# Patient Record
Sex: Male | Born: 1990 | Race: White | Hispanic: No | Marital: Single | Smoking: Current every day smoker
Health system: Southern US, Community
[De-identification: ages and names within clinical notes are randomized; demographics above are authoritative.]

## PROBLEM LIST (undated history)

## (undated) DIAGNOSIS — J45909 Unspecified asthma, uncomplicated: Secondary | ICD-10-CM

## (undated) DIAGNOSIS — F909 Attention-deficit hyperactivity disorder, unspecified type: Secondary | ICD-10-CM

## (undated) HISTORY — PX: TONSILLECTOMY: SUR1361

## (undated) HISTORY — PX: OTHER SURGICAL HISTORY: SHX169

## (undated) HISTORY — PX: WISDOM TOOTH EXTRACTION: SHX21

## (undated) HISTORY — DX: Attention-deficit hyperactivity disorder, unspecified type: F90.9

---
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
## 2012-08-22 ENCOUNTER — Ambulatory Visit (INDEPENDENT_AMBULATORY_CARE_PROVIDER_SITE_OTHER): Payer: BC Managed Care – PPO | Admitting: Diagnostic Neuroimaging

## 2012-08-22 ENCOUNTER — Encounter: Payer: Self-pay | Admitting: Diagnostic Neuroimaging

## 2012-08-22 VITALS — BP 138/74 | Ht 73.0 in | Wt 160.0 lb

## 2012-08-22 DIAGNOSIS — R200 Anesthesia of skin: Secondary | ICD-10-CM

## 2012-08-22 DIAGNOSIS — R209 Unspecified disturbances of skin sensation: Secondary | ICD-10-CM

## 2012-08-22 MED ORDER — AMITRIPTYLINE HCL 25 MG PO TABS: 25.0000 mg | ORAL_TABLET | Freq: Every day | ORAL | Status: DC

## 2012-08-22 NOTE — Progress Notes (Signed)
[facility]  PATIENT: Kevin Craig DOB: [birth year]  REFERRING CLINICIAN: [facility] HISTORY FROM: patient REASON FOR VISIT: new consult   HISTORICAL  CHIEF COMPLAINT:  Chief Complaint  Patient presents with  . Numbness    face  . Pain    legs, feet, back    HISTORY OF PRESENT ILLNESS:   22 year old right-handed male with ADHD, here for evaluation of posttraumatic numbness in the left face.  03/26/2012, patient was playing football with his friends, when he collided with another player. Patient was hit in his left maxillary and left peri-orbital region by someone else's head.patient immediately had pain and numbness in that region. He lost vision for 30 seconds, and felt spinning sensation. He was somewhat confused and dazed for approximately 2 days. No full loss of consciousness. Patient went to the emergency room was diagnosed with multiple facial fractures, treated conservatively.  Patient followed up with ENT, had some Lortab initially, but has been taking ibuprofen 400 mg every 6 hours since that time.  Patient continues to have left periorbital numbness, pain, pins and needles, twitching sensations. He also has left hemispheric headaches g and sensation. He has some dizziness and nausea as well.  Separately patient has several year history of bilateral foot and low back pain. He works 60 hours a week, standing up on concrete floors. Otherwise he is still fairly active playing basketball with friends and working at the gym several times per week.  REVIEW OF SYSTEMS: Full 14 system review of systems performed and notable only for fatigue, testing, blurred vision double vision loss of vision eye pain spinning sensation joint pain or stiffness runny nose skin sensitivity.  ALLERGIES: No Known Allergies  HOME MEDICATIONS: No outpatient prescriptions prior to visit.   No facility-administered medications prior to visit.    PAST MEDICAL  HISTORY: Past Medical History  Diagnosis Date  . ADHD (attention deficit hyperactivity disorder)     PAST SURGICAL HISTORY: History reviewed. No pertinent past surgical history.  FAMILY HISTORY: Family History  Problem Relation Age of Onset  . Diabetes Maternal Grandmother   . Breast cancer Maternal Grandmother   . Diabetes Maternal Grandfather     SOCIAL HISTORY:  History   Social History  . Marital Status: Single    Spouse Name: N/A    Number of Children: 0  . Years of Education: HS   Occupational History  .  [employer]   Social History Main Topics  . Smoking status: Current Every Day Smoker -- 0.50 packs/day    Types: Cigarettes  . Smokeless tobacco: Not on file  . Alcohol Use: Not on file     Comment: Few beers per week  . Drug Use: No  . Sexually Active: Not on file   Other Topics Concern  . Not on file   Social History Narrative   Pt lives at home alone.   Caffeine Use- 2 sodas daily     PHYSICAL EXAM  Filed Vitals:   08/22/12 0812  BP: 138/74  Height: 6\' 1"  (1.854 m)  Weight: 160 lb (72.576 kg)   Body mass index is 21.11 kg/(m^2).  GENERAL EXAM: Patient is in no distress  CARDIOVASCULAR: Regular rate and rhythm, no murmurs, no carotid bruits  NEUROLOGIC: MENTAL STATUS: awake, alert, language fluent, comprehension intact, naming intact CRANIAL NERVE: no papilledema on fundoscopic exam, pupils equal and reactive to light, visual fields full to confrontation, extraocular muscles intact, no nystagmus, decreased facial sensation in the  left V1 region, normal facial strength, uvula midline, shoulder shrug symmetric, tongue midline. MOTOR: normal bulk and tone, full strength in the BUE, BLE SENSORY: normal and symmetric to light touch, pinprick, temperature, vibration COORDINATION: finger-nose-finger, fine finger movements normal REFLEXES: deep tendon reflexes present and symmetric GAIT/STATION: narrow based gait; able to walk on toes,  heels and tandem; romberg is negative   DIAGNOSTIC DATA (LABS, IMAGING, TESTING) - I reviewed patient records, labs, notes, testing and imaging myself where available.  No results found for this basename: WBC, HGB, HCT, MCV, PLT   No results found for this basename: na, k, cl, co2, glucose, bun, creatinine, calcium, prot, albumin, ast, alt, alkphos, bilitot, gfrnonaa, gfraa   No results found for this basename: CHOL, HDL, LDLCALC, LDLDIRECT, TRIG, CHOLHDL   No results found for this basename: HGBA1C   No results found for this basename: VITAMINB12   No results found for this basename: TSH     ASSESSMENT AND PLAN  22 y.o. year old male  has a past medical history of ADHD (attention deficit hyperactivity disorder). here with left facial numbness, following left facial trauma while playing football with some friends. Patient having ongoing headaches and numbness. Will try amitriptyline 25 mg at night. I explained to the patient that these types of injuries can take a long time to recover, even up to 6-12 months. I suggested he take a daily multivitamin as well to promote nerve healing.   Meds ordered this encounter  Medications  . amitriptyline (ELAVIL) 25 MG tablet    Sig: Take 1 tablet (25 mg total) by mouth at bedtime.    Dispense:  30 tablet    Refill:  3     Suanne Marker, MD 08/22/2012, 9:03 AM Certified in Neurology, Neurophysiology and Neuroimaging  [facility] [address] [place], [place] [address] [phone]

## 2012-08-22 NOTE — Patient Instructions (Signed)
Try amitriptyline 25mg  before bedtime. Add daily multi-vitamin. Focus on core strengthening / flexibility exercises.

## 2012-10-20 ENCOUNTER — Encounter (HOSPITAL_COMMUNITY): Payer: Self-pay | Admitting: *Deleted

## 2012-10-20 ENCOUNTER — Emergency Department (HOSPITAL_COMMUNITY): Payer: BC Managed Care – PPO

## 2012-10-20 ENCOUNTER — Emergency Department (HOSPITAL_COMMUNITY)
Admission: EM | Admit: 2012-10-20 | Discharge: 2012-10-20 | Disposition: A | Discharge status: Home / Self Care | Payer: BC Managed Care – PPO | Attending: Emergency Medicine | Admitting: Emergency Medicine

## 2012-10-20 DIAGNOSIS — R079 Chest pain, unspecified: Secondary | ICD-10-CM | POA: Insufficient documentation

## 2012-10-20 DIAGNOSIS — R209 Unspecified disturbances of skin sensation: Secondary | ICD-10-CM | POA: Insufficient documentation

## 2012-10-20 DIAGNOSIS — J441 Chronic obstructive pulmonary disease with (acute) exacerbation: Secondary | ICD-10-CM | POA: Insufficient documentation

## 2012-10-20 DIAGNOSIS — R059 Cough, unspecified: Secondary | ICD-10-CM | POA: Insufficient documentation

## 2012-10-20 DIAGNOSIS — R42 Dizziness and giddiness: Secondary | ICD-10-CM | POA: Insufficient documentation

## 2012-10-20 DIAGNOSIS — J449 Chronic obstructive pulmonary disease, unspecified: Secondary | ICD-10-CM

## 2012-10-20 DIAGNOSIS — R0600 Dyspnea, unspecified: Secondary | ICD-10-CM

## 2012-10-20 DIAGNOSIS — R05 Cough: Secondary | ICD-10-CM | POA: Insufficient documentation

## 2012-10-20 DIAGNOSIS — F17200 Nicotine dependence, unspecified, uncomplicated: Secondary | ICD-10-CM | POA: Insufficient documentation

## 2012-10-20 DIAGNOSIS — Z8659 Personal history of other mental and behavioral disorders: Secondary | ICD-10-CM | POA: Insufficient documentation

## 2012-10-20 DIAGNOSIS — R110 Nausea: Secondary | ICD-10-CM | POA: Insufficient documentation

## 2012-10-20 HISTORY — DX: Unspecified asthma, uncomplicated: J45.909

## 2012-10-20 LAB — BLOOD GAS, ARTERIAL
Acid-Base Excess: 0.9 mmol/L (ref 0.0–2.0)
Bicarbonate: 25 mEq/L — ABNORMAL HIGH (ref 20.0–24.0)
O2 Saturation: 98.5 %
Patient temperature: 37
TCO2: 21.8 mmol/L (ref 0–100)
pCO2 arterial: 40.2 mmHg (ref 35.0–45.0)
pH, Arterial: 7.411 (ref 7.350–7.450)
pO2, Arterial: 114 mmHg — ABNORMAL HIGH (ref 80.0–100.0)

## 2012-10-20 LAB — POCT I-STAT TROPONIN I: Troponin i, poc: 0.01 ng/mL (ref 0.00–0.08)

## 2012-10-20 LAB — POCT I-STAT, CHEM 8
BUN: 14 mg/dL (ref 6–23)
Calcium, Ion: 1.18 mmol/L (ref 1.12–1.23)
Chloride: 106 mEq/L (ref 96–112)
Creatinine, Ser: 1 mg/dL (ref 0.50–1.35)
Glucose, Bld: 94 mg/dL (ref 70–99)
HCT: 47 % (ref 39.0–52.0)
Hemoglobin: 16 g/dL (ref 13.0–17.0)
Potassium: 3.6 mEq/L (ref 3.5–5.1)
Sodium: 141 mEq/L (ref 135–145)
TCO2: 24 mmol/L (ref 0–100)

## 2012-10-20 LAB — D-DIMER, QUANTITATIVE: D-Dimer, Quant: 0.29 ug/mL-FEU (ref 0.00–0.48)

## 2012-10-20 MED ORDER — ALBUTEROL SULFATE (5 MG/ML) 0.5% IN NEBU
INHALATION_SOLUTION | RESPIRATORY_TRACT | Status: AC
  Administered 2012-10-20: 5 mg
  Filled 2012-10-20: qty 1

## 2012-10-20 MED ORDER — ALBUTEROL SULFATE (5 MG/ML) 0.5% IN NEBU
5.0000 mg | INHALATION_SOLUTION | Freq: Once | RESPIRATORY_TRACT | Status: AC
  Administered 2012-10-20: 5 mg via RESPIRATORY_TRACT
  Filled 2012-10-20: qty 1

## 2012-10-20 MED ORDER — ASPIRIN 81 MG PO CHEW
324.0000 mg | CHEWABLE_TABLET | Freq: Once | ORAL | Status: AC
  Administered 2012-10-20: 324 mg via ORAL
  Filled 2012-10-20: qty 4

## 2012-10-20 MED ORDER — ALBUTEROL SULFATE HFA 108 (90 BASE) MCG/ACT IN AERS
2.0000 | INHALATION_SPRAY | RESPIRATORY_TRACT | Status: DC | PRN
  Administered 2012-10-20: 2 via RESPIRATORY_TRACT
  Filled 2012-10-20: qty 6.7

## 2012-10-20 NOTE — ED Provider Notes (Signed)
History     This chart was scribed for Kevin Quarry, MD, MD by Smitty Pluck, ED Scribe. The patient was seen in room APA10/APA10 and the patient's care was started at 7:31 AM.   CSN: [id number]  Arrival date & time 10/20/12  0631      Chief Complaint  Patient presents with  . Chest Pain  . Dizziness    Patient is a 22 y.o. male presenting with chest pain. The history is provided by the patient and medical records. No language interpreter was used.  Chest Pain Pain location:  L chest Pain quality: dull and sharp   Pain radiates to:  L shoulder Pain radiates to the back: no   Pain severity:  Moderate Onset quality:  Sudden Timing:  Intermittent Progression:  Worsening Chronicity:  Recurrent Relieved by:  Nothing Worsened by:  Deep breathing and exertion Associated symptoms: dizziness, nausea and numbness   Associated symptoms: no fever and not vomiting    HPI Comments: Kevin Craig is a 22 y.o. male with hx of asthma who presents to the Emergency Department complaining of intermittent, moderate left chest pain that has been ongoing for past year but worsened within past week. He states the chest pain is dull and sharp. He reports that the pain radiates across his chest to left shoulder. He mentions the episodes last for hours. Pain is rated at 9/10 at worse and currently 5/10. He mentions that sleeping relieves the pain and deeply breathing aggravates the pain. He states that his job involves strenuous activity. He mentions having associated dizziness, nausea, productive cough with white sputum, numbness in bilateral arms and face and SOB.  Pt denies diaphoresis, fever, chills, vomiting, diarrhea, weakness and any other pain. Pt reports he smokes cigarettes. Denies hx of DVT, MI and any other cardiac problems. No family hx of pneumothorax.     Does not have PCP.    Past Medical History  Diagnosis Date  . ADHD (attention deficit hyperactivity disorder)     History reviewed.  No pertinent past surgical history.  Family History  Problem Relation Age of Onset  . Diabetes Maternal Grandmother   . Breast cancer Maternal Grandmother   . Diabetes Maternal Grandfather     History  Substance Use Topics  . Smoking status: Current Every Day Smoker -- 0.50 packs/day    Types: Cigarettes  . Smokeless tobacco: Not on file  . Alcohol Use: Not on file     Comment: Few beers per week      Review of Systems  Constitutional: Negative for fever and chills.  Cardiovascular: Positive for chest pain.  Gastrointestinal: Positive for nausea. Negative for vomiting.  Neurological: Positive for dizziness and numbness.  All other systems reviewed and are negative.    Allergies  Review of patient's allergies indicates no known allergies.  Home Medications  No current outpatient prescriptions on file.  BP 144/87  Pulse 67  Temp(Src) 97.4 F (36.3 C) (Oral)  Resp 20  Ht 6' 2.5" (1.892 m)  Wt 165 lb (74.844 kg)  BMI 20.91 kg/m2  SpO2 100%  Physical Exam  Nursing note and vitals reviewed. Constitutional: He is oriented to person, place, and time. He appears well-developed and well-nourished. No distress.  HENT:  Head: Normocephalic and atraumatic.  Right Ear: External ear normal.  Left Ear: External ear normal.  Nose: Nose normal.  Mouth/Throat: Oropharynx is clear and moist.  Eyes: Conjunctivae and EOM are normal. Pupils are equal, round, and reactive  to light.  Neck: Normal range of motion. Neck supple.  Cardiovascular: Normal rate, regular rhythm, normal heart sounds and intact distal pulses.   No murmur heard. Pulmonary/Chest: Effort normal and breath sounds normal. No respiratory distress. He has no wheezes. He has no rales. He exhibits no tenderness.  Abdominal: Soft. Bowel sounds are normal. He exhibits no distension and no mass. There is no tenderness. There is no guarding.  Musculoskeletal: Normal range of motion. He exhibits no edema.  Neurological:  He is alert and oriented to person, place, and time. He has normal reflexes. He exhibits normal muscle tone. Coordination normal.  Skin: Skin is warm and dry.  Psychiatric: He has a normal mood and affect. His behavior is normal. Judgment and thought content normal.    ED Course  Procedures (including critical care time) DIAGNOSTIC STUDIES: Oxygen Saturation is 100% on room air, normal by my interpretation.    COORDINATION OF CARE: 7:39 AM Discussed ED treatment with pt and pt agrees.  7:40 AM Ordered:  Medications  albuterol (PROVENTIL) (5 MG/ML) 0.5% nebulizer solution 5 mg (5 mg Nebulization Given 10/20/12 0804)  aspirin chewable tablet 324 mg (324 mg Oral Given 10/20/12 0804)      Labs Reviewed  D-DIMER, QUANTITATIVE  BLOOD GAS, ARTERIAL  POCT I-STAT, CHEM 8  POCT I-STAT TROPONIN I   Dg Chest 2 View  10/20/2012   *RADIOLOGY REPORT*  Clinical Data: Chest pain  CHEST - 2 VIEW  Comparison: None.  Findings: The heart and pulmonary vascularity are within normal limits.  The lungs are well-aerated mildly hyperinflated.  No acute bony abnormality is seen.  IMPRESSION: COPD without acute abnormality.   Original Report Authenticated By: Alcide Clever, M.D.    I have reviewed the report and personally reviewed the above radiology studies.   No diagnosis found.    Date: 10/20/2012  Rate: 57  Rhythm: normal sinus rhythm  QRS Axis: normal  Intervals: normal  ST/T Wave abnormalities: normal  Conduction Disutrbances:none  Narrative Interpretation:   Old EKG Reviewed: none available   MDM  Patient with sharp chest pain for a month with some dyspnea.  ddx- cad-atypical pain, no acute changes, normal troponin, low risk given age and history  Pneumothorax- no evidence on cxr  pe- normal d-dimer  Myositis/pericarditis- normal cardiac silhouette  Pneumonia No fever, cough, infiltrate  Obstructive lung disease patient with known asthma not taking albuterol as scheduled, continues to  smoke, but no obvious source of pain.  Patient given albuterol mdi.  Advised close follow up and return precautions given.   Kevin Quarry, MD 10/20/12 1011

## 2012-10-20 NOTE — ED Notes (Signed)
Patient has had episodes of this same chest pain x 1-2 years.  States it can happen while at rest or with activity.  Associated w/lightheadedness, occasional SOB and difficulty taking deep breath d/t pain. No nausea.  Most recent episode began yesterday while mowing grass.  Also notes he experiences feeling like his heart is racing, then when rate settles, feels like heart is pounding harder. Pain varies in intensity and quality, from sharp to dull, severe to mild.  Primarily located over left chest, radiating to shoulder and back, but has also experienced pain across entirety of chest.  Immediate family history of heart disease.  Believes his sister has irregular heartbeat.

## 2012-10-20 NOTE — ED Notes (Signed)
Notified Dr. Rosalia Hammers that patient wants to speak to her prior to discharge.

## 2012-10-20 NOTE — ED Notes (Signed)
Pt states he has had bouts of chest pain over the last 2 days. Tonight, the chest pain woke him up from his sleep. Pt describes left sided chest pain as sharp and tight. Now pain is dull but his chest still feels tight and is radiating to his left back. Pt states it is getting harder for him to breath.

## 2012-10-20 NOTE — ED Notes (Signed)
After neb treatment, patient states it is easier to breath, but pain in L chest is still present.

## 2013-03-09 ENCOUNTER — Telehealth: Payer: Self-pay | Admitting: Diagnostic Neuroimaging

## 2013-03-09 NOTE — Telephone Encounter (Signed)
Called pt to remind him of his apointment  03/11/13 with [facility] phone# is no longer in service.

## 2013-03-11 ENCOUNTER — Ambulatory Visit: Payer: BC Managed Care – PPO | Admitting: Diagnostic Neuroimaging

## 2014-06-18 ENCOUNTER — Encounter (HOSPITAL_COMMUNITY): Payer: Self-pay

## 2014-06-18 ENCOUNTER — Emergency Department (HOSPITAL_COMMUNITY)
Admission: EM | Admit: 2014-06-18 | Discharge: 2014-06-18 | Disposition: A | Discharge status: Home / Self Care | Payer: 59 | Attending: Emergency Medicine | Admitting: Emergency Medicine

## 2014-06-18 DIAGNOSIS — J45909 Unspecified asthma, uncomplicated: Secondary | ICD-10-CM | POA: Diagnosis not present

## 2014-06-18 DIAGNOSIS — Z720 Tobacco use: Secondary | ICD-10-CM | POA: Diagnosis not present

## 2014-06-18 DIAGNOSIS — M545 Low back pain: Secondary | ICD-10-CM | POA: Diagnosis present

## 2014-06-18 DIAGNOSIS — Z8659 Personal history of other mental and behavioral disorders: Secondary | ICD-10-CM | POA: Diagnosis not present

## 2014-06-18 DIAGNOSIS — M5431 Sciatica, right side: Secondary | ICD-10-CM | POA: Diagnosis not present

## 2014-06-18 LAB — URINALYSIS, ROUTINE W REFLEX MICROSCOPIC
Bilirubin Urine: NEGATIVE
Glucose, UA: NEGATIVE mg/dL
Ketones, ur: NEGATIVE mg/dL
Leukocytes, UA: NEGATIVE
Nitrite: NEGATIVE
Protein, ur: NEGATIVE mg/dL
Specific Gravity, Urine: >1.03 — ABNORMAL HIGH (ref 1.005–1.030)
Urobilinogen, UA: 0.2 mg/dL (ref 0.0–1.0)
pH: 6 (ref 5.0–8.0)

## 2014-06-18 LAB — URINE MICROSCOPIC-ADD ON

## 2014-06-18 MED ORDER — ONDANSETRON 4 MG PO TBDP
4.0000 mg | ORAL_TABLET | Freq: Once | ORAL | Status: AC
  Administered 2014-06-18: 4 mg via ORAL
  Filled 2014-06-18: qty 1

## 2014-06-18 MED ORDER — OXYCODONE-ACETAMINOPHEN 5-325 MG PO TABS
1.0000 | ORAL_TABLET | Freq: Once | ORAL | Status: AC
  Administered 2014-06-18: 1 via ORAL
  Filled 2014-06-18: qty 1

## 2014-06-18 MED ORDER — CYCLOBENZAPRINE HCL 10 MG PO TABS
10.0000 mg | ORAL_TABLET | Freq: Once | ORAL | Status: AC
  Administered 2014-06-18: 10 mg via ORAL
  Filled 2014-06-18: qty 1

## 2014-06-18 MED ORDER — KETOROLAC TROMETHAMINE 60 MG/2ML IM SOLN
60.0000 mg | Freq: Once | INTRAMUSCULAR | Status: AC
  Administered 2014-06-18: 60 mg via INTRAMUSCULAR
  Filled 2014-06-18: qty 2

## 2014-06-18 MED ORDER — OXYCODONE-ACETAMINOPHEN 5-325 MG PO TABS: 1.0000 | ORAL_TABLET | Freq: Four times a day (QID) | ORAL | Status: DC | PRN

## 2014-06-18 MED ORDER — CYCLOBENZAPRINE HCL 10 MG PO TABS: 10.0000 mg | ORAL_TABLET | Freq: Two times a day (BID) | ORAL | Status: DC | PRN

## 2014-06-18 MED ORDER — NAPROXEN 500 MG PO TABS: 500.0000 mg | ORAL_TABLET | Freq: Two times a day (BID) | ORAL | Status: DC

## 2014-06-18 NOTE — Discharge Instructions (Signed)
Your exam today shows that your pain is muscular. You do have a trace of blood in your urine that should be followed up by your primary care doctor. We are treating your pain and muscle spasm. Take the medications as directed. Do not take the muscle relaxant or narcotic if you are driving as it will make you sleepy.

## 2014-06-18 NOTE — ED Provider Notes (Signed)
CSN: [id number]     Arrival date & time 06/18/14  0825 History   First MD Initiated Contact with Patient 06/18/14 0830     Chief Complaint  Patient presents with  . Back Pain     (Consider location/radiation/quality/duration/timing/severity/associated sxs/prior Treatment) Patient is a 24 y.o. male presenting with back pain. The history is provided by the patient.  Back Pain Location:  Lumbar spine Quality:  Shooting Radiates to: right buttock. Pain severity:  Severe Pain is:  Same all the time Onset quality:  Sudden Duration:  1 day Timing:  Constant Chronicity:  New Relieved by:  Nothing Worsened by:  Movement, standing, bending and ambulation Associated symptoms: no bladder incontinence, no bowel incontinence and no dysuria    Kevin Craig is a 24 y.o. male who presents to the ED with low right side back pain that started yesterday. Patient states he got in his truck to go to work and had a sudden pain in his lower back. He has continued to have the pain. He has taken ibuprofen without relief. He denies UTI symptoms.  Past Medical History  Diagnosis Date  . ADHD (attention deficit hyperactivity disorder)   . Asthma    Past Surgical History  Procedure Laterality Date  . Tonsillectomy    . Tubes in ears     Family History  Problem Relation Age of Onset  . Diabetes Maternal Grandmother   . Breast cancer Maternal Grandmother   . Diabetes Maternal Grandfather    History  Substance Use Topics  . Smoking status: Current Every Day Smoker -- 0.50 packs/day    Types: Cigarettes  . Smokeless tobacco: Not on file  . Alcohol Use: Not on file     Comment: Few beers per week    Review of Systems  Gastrointestinal: Negative for bowel incontinence.  Genitourinary: Negative for bladder incontinence and dysuria.  Musculoskeletal: Positive for back pain.  all other systems negative    Allergies  Review of patient's allergies indicates no known allergies.  Home  Medications   Prior to Admission medications   Medication Sig Start Date End Date Taking? Authorizing Provider  ibuprofen (ADVIL,MOTRIN) 200 MG tablet Take 400 mg by mouth every 8 (eight) hours as needed for moderate pain.    Yes Historical Provider, MD  oxymetazoline (AFRIN) 0.05 % nasal spray Place 1 spray into both nostrils 2 (two) times daily as needed for congestion.   Yes Historical Provider, MD  cyclobenzaprine (FLEXERIL) 10 MG tablet Take 1 tablet (10 mg total) by mouth 2 (two) times daily as needed for muscle spasms. 06/18/14   Tatumn Corbridge Orlene Och, NP  naproxen (NAPROSYN) 500 MG tablet Take 1 tablet (500 mg total) by mouth 2 (two) times daily. 06/18/14   Xai Frerking Orlene Och, NP  oxyCODONE-acetaminophen (ROXICET) 5-325 MG per tablet Take 1 tablet by mouth every 6 (six) hours as needed for severe pain. 06/18/14   Kailin Principato Orlene Och, NP   BP 123/89 mmHg  Pulse 99  Temp(Src) 97.6 F (36.4 C) (Oral)  Resp 16  Ht  (1.88 m)  Wt 170 lb (77.111 kg)  BMI 21.82 kg/m2  SpO2 100% Physical Exam  Constitutional: He is oriented to person, place, and time. He appears well-developed and well-nourished. No distress.  HENT:  Head: Normocephalic and atraumatic.  Eyes: EOM are normal. Pupils are equal, round, and reactive to light.  Neck: Normal range of motion. Neck supple.  Cardiovascular: Normal rate and regular rhythm.   Pulmonary/Chest: Effort normal.  No respiratory distress. He has no wheezes. He has no rales.  Abdominal: Soft. Bowel sounds are normal. There is no tenderness.  Musculoskeletal: He exhibits no edema.       Lumbar back: He exhibits tenderness, pain and spasm. He exhibits normal range of motion, no bony tenderness, no deformity and normal pulse.       Back:  Pain with straight leg raises. Radial and pedal pulses 2+, adequate circulation, good touch sensation.   Neurological: He is alert and oriented to person, place, and time. He has normal strength. No cranial nerve deficit or sensory deficit.  Coordination and gait normal.  Reflex Scores:      Bicep reflexes are 2+ on the right side and 2+ on the left side.      Brachioradialis reflexes are 2+ on the right side and 2+ on the left side.      Patellar reflexes are 2+ on the right side and 2+ on the left side.      Achilles reflexes are 2+ on the right side and 2+ on the left side. Ambulatory without foot drag.   Skin: Skin is warm and dry.  Psychiatric: He has a normal mood and affect. His behavior is normal.  Nursing note and vitals reviewed.   ED Course  Procedures  Results for orders placed or performed during the hospital encounter of 06/18/14 (from the past 24 hour(s))  Urinalysis, Routine w reflex microscopic     Status: Abnormal   Collection Time: 06/18/14  8:49 AM  Result Value Ref Range   Color, Urine YELLOW YELLOW   APPearance CLEAR CLEAR   Specific Gravity, Urine >1.030 (H) 1.005 - 1.030   pH 6.0 5.0 - 8.0   Glucose, UA NEGATIVE NEGATIVE mg/dL   Hgb urine dipstick TRACE (A) NEGATIVE   Bilirubin Urine NEGATIVE NEGATIVE   Ketones, ur NEGATIVE NEGATIVE mg/dL   Protein, ur NEGATIVE NEGATIVE mg/dL   Urobilinogen, UA 0.2 0.0 - 1.0 mg/dL   Nitrite NEGATIVE NEGATIVE   Leukocytes, UA NEGATIVE NEGATIVE  Urine microscopic-add on     Status: None   Collection Time: 06/18/14  8:49 AM  Result Value Ref Range   RBC / HPF 3-6 <3 RBC/hpf    I discussed this case with Dr. Hyacinth MeekerMiller. Patient does have a trace of blood in his urine but exam does not indicate kidney stone but rather muscular pain.  MDM  24 y.o. male with low back pain that started suddenly yesterday and has gotten progressively worse. Will treat for pain and muscle spasm. Patient stable for discharge without neuro deficits and without symptoms that would require immediate neuro consult. Discussed with the patient and all questioned fully answered. He will return if any problems arise.   Final diagnoses:  Sciatica, right        [facility] M Llewellyn Schoenberger, NP 06/18/14  1710  Vida RollerBrian D Miller, MD 06/19/14 1714

## 2014-06-18 NOTE — ED Notes (Signed)
MD at bedside. 

## 2014-06-18 NOTE — ED Notes (Addendum)
Pt reports last night at work started having pain in r lower back.  Reports throughout the night the pain has radiated across lower back and hips.  Denies injury, denies urinary symptoms.  Reports pain worse with movement.

## 2018-10-23 ENCOUNTER — Emergency Department (HOSPITAL_COMMUNITY)
Admission: EM | Admit: 2018-10-23 | Discharge: 2018-10-23 | Disposition: A | Discharge status: Home / Self Care | Payer: 59 | Attending: Emergency Medicine | Admitting: Emergency Medicine

## 2018-10-23 ENCOUNTER — Other Ambulatory Visit: Payer: Self-pay

## 2018-10-23 ENCOUNTER — Encounter (HOSPITAL_COMMUNITY): Payer: Self-pay | Admitting: Emergency Medicine

## 2018-10-23 ENCOUNTER — Emergency Department (HOSPITAL_COMMUNITY): Payer: 59

## 2018-10-23 DIAGNOSIS — J45909 Unspecified asthma, uncomplicated: Secondary | ICD-10-CM | POA: Diagnosis not present

## 2018-10-23 DIAGNOSIS — R072 Precordial pain: Secondary | ICD-10-CM | POA: Diagnosis not present

## 2018-10-23 DIAGNOSIS — F17210 Nicotine dependence, cigarettes, uncomplicated: Secondary | ICD-10-CM | POA: Insufficient documentation

## 2018-10-23 DIAGNOSIS — R079 Chest pain, unspecified: Secondary | ICD-10-CM | POA: Diagnosis present

## 2018-10-23 LAB — BASIC METABOLIC PANEL
Anion gap: 8 (ref 5–15)
BUN: 15 mg/dL (ref 6–20)
CO2: 27 mmol/L (ref 22–32)
Calcium: 9.7 mg/dL (ref 8.9–10.3)
Chloride: 107 mmol/L (ref 98–111)
Creatinine, Ser: 0.95 mg/dL (ref 0.61–1.24)
GFR calc Af Amer: >60 mL/min (ref >60)
GFR calc non Af Amer: >60 mL/min (ref >60)
Glucose, Bld: 92 mg/dL (ref 70–99)
Potassium: 3.8 mmol/L (ref 3.5–5.1)
Sodium: 142 mmol/L (ref 135–145)

## 2018-10-23 LAB — CBC
HCT: 45.6 % (ref 39.0–52.0)
Hemoglobin: 15.1 g/dL (ref 13.0–17.0)
MCH: 31.1 pg (ref 26.0–34.0)
MCHC: 33.1 g/dL (ref 30.0–36.0)
MCV: 94 fL (ref 80.0–100.0)
Platelets: 210 10*3/uL (ref 150–400)
RBC: 4.85 MIL/uL (ref 4.22–5.81)
RDW: 13.4 % (ref 11.5–15.5)
WBC: 7.4 10*3/uL (ref 4.0–10.5)
nRBC: 0 % (ref 0.0–0.2)

## 2018-10-23 LAB — TROPONIN I: Troponin I: <0.03 ng/mL (ref <0.03)

## 2018-10-23 LAB — D-DIMER, QUANTITATIVE: D-Dimer, Quant: <0.27 ug/mL-FEU (ref 0.00–0.50)

## 2018-10-23 MED ORDER — IBUPROFEN 800 MG PO TABS: 800.0000 mg | ORAL_TABLET | Freq: Three times a day (TID) | ORAL | 0 refills | Status: DC | PRN

## 2018-10-23 MED ORDER — SODIUM CHLORIDE 0.9% FLUSH: 3.0000 mL | Freq: Once | INTRAVENOUS | Status: DC

## 2018-10-23 MED ORDER — DICLOFENAC SODIUM 1 % TD GEL: 2.0000 g | Freq: Four times a day (QID) | TRANSDERMAL | 0 refills | Status: DC | PRN

## 2018-10-23 NOTE — ED Triage Notes (Signed)
Patient reports L upper chest pain that started a day and a half ago.

## 2018-10-23 NOTE — ED Provider Notes (Signed)
Emergency Department Provider Note   I have reviewed the triage vital signs and the nursing notes.   HISTORY  Chief Complaint Chest Pain   HPI Kevin Craig is a 28 y.o. male with PMH of ADHD, asthma, and tobacco use presents to the emergency department for evaluation of left-sided chest pain which began yesterday.  Patient states that he had sudden sharp chest pain in the left to mid chest.  He inhaled deeply and felt a popping sensation followed by constant pressure.  The pressure sensation radiates to underneath his left shoulder blade. The pressure has been constant since pain began yesterday.  He is feeling somewhat short of breath.  Pain is worse with moving the left shoulder or "flexing." No other modifying factors. No fever or cough.   Past Medical History:  Diagnosis Date  . ADHD (attention deficit hyperactivity disorder)   . Asthma     Patient Active Problem List   Diagnosis Date Noted  . Left facial numbness 08/22/2012    Past Surgical History:  Procedure Laterality Date  . TONSILLECTOMY    . tubes in ears    . WISDOM TOOTH EXTRACTION      Allergies Patient has no known allergies.  Family History  Problem Relation Age of Onset  . Diabetes Maternal Grandmother   . Breast cancer Maternal Grandmother   . Diabetes Maternal Grandfather     Social History Social History   Tobacco Use  . Smoking status: Current Every Day Smoker    Packs/day: 0.50    Types: Cigarettes  . Smokeless tobacco: Former Engineer, waterUser  Substance Use Topics  . Alcohol use: Not on file    Comment: every other day  . Drug use: No    Review of Systems  Constitutional: No fever/chills Eyes: No visual changes. ENT: No sore throat. Cardiovascular: Positive chest pain. Respiratory: Positive shortness of breath. Gastrointestinal: No abdominal pain.  No nausea, no vomiting.  No diarrhea.  No constipation. Genitourinary: Negative for dysuria. Musculoskeletal: Negative for back pain.  Skin: Negative for rash. Neurological: Negative for headaches, focal weakness or numbness.  10-point ROS otherwise negative.  ____________________________________________   PHYSICAL EXAM:  VITAL SIGNS: ED Triage Vitals  Enc Vitals Group     BP 10/23/18 1850 (!) 147/91     Pulse Rate 10/23/18 1850 63     Resp 10/23/18 1850 20     Temp 10/23/18 1850 98.7 F (37.1 C)     Temp Source 10/23/18 1850 Temporal     SpO2 10/23/18 1850 100 %     Weight 10/23/18 1850 175 lb (79.4 kg)     Height 10/23/18 1850 6\' 3"  (1.905 m)     Pain Score 10/23/18 1852 7   Constitutional: Alert and oriented. Well appearing and in no acute distress. Eyes: Conjunctivae are normal.  Head: Atraumatic. Nose: No congestion/rhinnorhea. Mouth/Throat: Mucous membranes are moist.  Neck: No stridor.  Cardiovascular: Normal rate, regular rhythm. Good peripheral circulation. Grossly normal heart sounds.   Respiratory: Normal respiratory effort.  No retractions. Lungs CTAB. Gastrointestinal: Soft and nontender. No distention.  Musculoskeletal: No lower extremity tenderness nor edema. No gross deformities of extremities. Tenderness to palpation over the sternums and left anterior chest. No crepitus or deformity/bruising.  Neurologic:  Normal speech and language.  Skin:  Skin is warm, dry and intact. No rash noted.   ____________________________________________   LABS (all labs ordered are listed, but only abnormal results are displayed)  Labs Reviewed  BASIC METABOLIC PANEL  CBC  TROPONIN I  D-DIMER, QUANTITATIVE (NOT AT [facility])   ____________________________________________  EKG   EKG Interpretation  Date/Time:  Thursday October 23 2018 18:55:41 EDT Ventricular Rate:  74 PR Interval:  142 QRS Duration: 98 QT Interval:  364 QTC Calculation: 404 R Axis:   120 Text Interpretation:  Normal sinus rhythm with sinus arrhythmia Right atrial enlargement Incomplete right bundle branch block Abnormal ECG Similar  to prior. No STEMI  Confirmed by Alona Bene [id number]) on 10/23/2018 6:58:48 PM       ____________________________________________  RADIOLOGY  Dg Chest 2 View  Result Date: 10/23/2018 CLINICAL DATA:  28 year old male with chest pain. EXAM: CHEST - 2 VIEW COMPARISON:  Chest radiograph dated 10/20/2012 FINDINGS: The heart size and mediastinal contours are within normal limits. Both lungs are clear. The visualized skeletal structures are unremarkable. IMPRESSION: No active cardiopulmonary disease. Electronically Signed   By: Elgie Collard M.D.   On: 10/23/2018 19:36    ____________________________________________   PROCEDURES  Procedure(s) performed:   Procedures  None ____________________________________________   INITIAL IMPRESSION / ASSESSMENT AND PLAN / ED COURSE  Pertinent labs & imaging results that were available during my care of the patient were reviewed by me and considered in my medical decision making (see chart for details).   Patient presents to the emergency department with with what began as acute, sharp chest pain and now is constant pressure.  EKG reviewed.  He does have a partial right bundle which does seem similar to his prior tracing.  No acute ischemic findings.  My suspicion for ACS is extremely low.  Given possible right heart strain on EKG I do plan to add a d-dimer although my suspicion for PE is also very low.  Patient's pain appears to be musculoskeletal.  Chest x-ray reviewed with no acute findings.  No wheezing on exam.   D-dimer negative.  Plan for treatment of likely MSK chest pain.  Discussed ED return precautions and PCP follow-up plan in detail. ____________________________________________  FINAL CLINICAL IMPRESSION(S) / ED DIAGNOSES  Final diagnoses:  Precordial chest pain    NEW OUTPATIENT MEDICATIONS STARTED DURING THIS VISIT:  Discharge Medication List as of 10/23/2018 11:02 PM    START taking these medications   Details  diclofenac  sodium (VOLTAREN) 1 % GEL Apply 2 g topically 4 (four) times daily as needed., Starting Thu 10/23/2018, Print    ibuprofen (ADVIL) 800 MG tablet Take 1 tablet (800 mg total) by mouth every 8 (eight) hours as needed for moderate pain., Starting Thu 10/23/2018, Print        Note:  This document was prepared using Dragon voice recognition software and may include unintentional dictation errors.  Alona Bene, MD Emergency Medicine    Brady Schiller, Arlyss Repress, MD 10/24/18 [phone]

## 2018-10-23 NOTE — Discharge Instructions (Signed)

## 2020-09-20 ENCOUNTER — Ambulatory Visit
Admission: EM | Admit: 2020-09-20 | Discharge: 2020-09-20 | Disposition: A | Discharge status: Home / Self Care | Payer: 59 | Attending: Family Medicine | Admitting: Family Medicine

## 2020-09-20 ENCOUNTER — Other Ambulatory Visit: Payer: Self-pay

## 2020-09-20 ENCOUNTER — Encounter: Payer: Self-pay | Admitting: Emergency Medicine

## 2020-09-20 DIAGNOSIS — R110 Nausea: Secondary | ICD-10-CM

## 2020-09-20 DIAGNOSIS — R030 Elevated blood-pressure reading, without diagnosis of hypertension: Secondary | ICD-10-CM

## 2020-09-20 DIAGNOSIS — F1010 Alcohol abuse, uncomplicated: Secondary | ICD-10-CM

## 2020-09-20 DIAGNOSIS — R1013 Epigastric pain: Secondary | ICD-10-CM

## 2020-09-20 MED ORDER — ESOMEPRAZOLE MAGNESIUM 40 MG PO CPDR: 40.0000 mg | DELAYED_RELEASE_CAPSULE | Freq: Every day | ORAL | 0 refills | Status: DC

## 2020-09-20 MED ORDER — ONDANSETRON 4 MG PO TBDP: 4.0000 mg | ORAL_TABLET | Freq: Three times a day (TID) | ORAL | 0 refills | Status: DC | PRN

## 2020-09-20 MED ORDER — SUCRALFATE 1 G PO TABS: 1.0000 g | ORAL_TABLET | Freq: Three times a day (TID) | ORAL | 0 refills | Status: DC

## 2020-09-20 NOTE — ED Triage Notes (Signed)
Epigastric pain x 3 days . Nausea ,no vomiting but is having diarrhea.

## 2020-09-20 NOTE — Discharge Instructions (Addendum)
You have been seen today for abdominal pain. Your evaluation was not suggestive of any emergent condition requiring medical intervention at this time. However, some abdominal problems make take more time to appear. Therefore, it is very important for you to pay attention to any new symptoms or worsening of your current condition.  Please return here or to the Emergency Department immediately should you begin to feel worse in any way or have any of the following symptoms: increasing or different abdominal pain, persistent vomiting, inability to drink fluids, fevers, or shaking chills.   You have had labs (blood work) drawn today. We will call you with any significant abnormalities or if there is need to begin or change treatment or pursue further follow up.  You may also review your test results online through MyChart. If you do not have a MyChart account, instructions to sign up should be on your discharge paperwork.  

## 2020-09-21 LAB — CBC WITH DIFFERENTIAL/PLATELET
Basophils Absolute: 0.1 10*3/uL (ref 0.0–0.2)
Basos: 1 %
EOS (ABSOLUTE): 0.1 10*3/uL (ref 0.0–0.4)
Eos: 3 %
Hematocrit: 43.5 % (ref 37.5–51.0)
Hemoglobin: 15 g/dL (ref 13.0–17.7)
Immature Grans (Abs): 0 10*3/uL (ref 0.0–0.1)
Immature Granulocytes: 1 %
Lymphocytes Absolute: 1.3 10*3/uL (ref 0.7–3.1)
Lymphs: 30 %
MCH: 32.1 pg (ref 26.6–33.0)
MCHC: 34.5 g/dL (ref 31.5–35.7)
MCV: 93 fL (ref 79–97)
Monocytes Absolute: 0.7 10*3/uL (ref 0.1–0.9)
Monocytes: 16 %
Neutrophils Absolute: 2.1 10*3/uL (ref 1.4–7.0)
Neutrophils: 49 %
Platelets: 215 10*3/uL (ref 150–450)
RBC: 4.68 x10E6/uL (ref 4.14–5.80)
RDW: 12.7 % (ref 11.6–15.4)
WBC: 4.2 10*3/uL (ref 3.4–10.8)

## 2020-09-21 LAB — COMPREHENSIVE METABOLIC PANEL
ALT: 17 IU/L (ref 0–44)
AST: 15 IU/L (ref 0–40)
Albumin/Globulin Ratio: 2.2 (ref 1.2–2.2)
Albumin: 4.7 g/dL (ref 4.1–5.2)
Alkaline Phosphatase: 57 IU/L (ref 44–121)
BUN/Creatinine Ratio: 11 (ref 9–20)
BUN: 12 mg/dL (ref 6–20)
Bilirubin Total: 0.2 mg/dL (ref 0.0–1.2)
CO2: 21 mmol/L (ref 20–29)
Calcium: 9.4 mg/dL (ref 8.7–10.2)
Chloride: 103 mmol/L (ref 96–106)
Creatinine, Ser: 1.08 mg/dL (ref 0.76–1.27)
Globulin, Total: 2.1 g/dL (ref 1.5–4.5)
Glucose: 85 mg/dL (ref 65–99)
Potassium: 3.7 mmol/L (ref 3.5–5.2)
Sodium: 140 mmol/L (ref 134–144)
Total Protein: 6.8 g/dL (ref 6.0–8.5)
eGFR: 95 mL/min/{1.73_m2} (ref >59)

## 2020-09-21 NOTE — ED Provider Notes (Signed)
[facility] CARE CENTER   [id number] 09/20/20 Arrival Time: 1803  ASSESSMENT & PLAN:  1. Epigastric pain   2. Alcohol abuse   3. Nausea without vomiting   4. Elevated blood pressure reading without diagnosis of hypertension    Labs pending. Question gastric vs pancreas etiology. Discussed. He is comfortable with home observation. Declines ED eval this evening. Reports no alcohol use in 2-3 days; no significant withdrawal symptoms.  Begin: Meds ordered this encounter  Medications  . esomeprazole (NEXIUM) 40 MG capsule    Sig: Take 1 capsule (40 mg total) by mouth daily.    Dispense:  30 capsule    Refill:  0  . sucralfate (CARAFATE) 1 g tablet    Sig: Take 1 tablet (1 g total) by mouth 4 (four) times daily -  with meals and at bedtime.    Dispense:  28 tablet    Refill:  0  . ondansetron (ZOFRAN-ODT) 4 MG disintegrating tablet    Sig: Take 1 tablet (4 mg total) by mouth every 8 (eight) hours as needed for nausea or vomiting.    Dispense:  15 tablet    Refill:  0     Discharge Instructions     You have been seen today for abdominal pain. Your evaluation was not suggestive of any emergent condition requiring medical intervention at this time. However, some abdominal problems make take more time to appear. Therefore, it is very important for you to pay attention to any new symptoms or worsening of your current condition.  Please return here or to the Emergency Department immediately should you begin to feel worse in any way or have any of the following symptoms: increasing or different abdominal pain, persistent vomiting, inability to drink fluids, fevers, or shaking chills.   You have had labs (blood work) drawn today. We will call you with any significant abnormalities or if there is need to begin or change treatment or pursue further follow up.  You may also review your test results online through MyChart. If you do not have a MyChart account, instructions to sign up should be  on your discharge paperwork.     Follow-up Information    [facility] EMERGENCY DEPARTMENT.   Specialty: Emergency Medicine Why: If symptoms worsen in any way. Contact information: [address] [id number] [place] [address] [phone]             May f/u here anytime to recheck BP.  Reviewed expectations re: course of current medical issues. Questions answered. Outlined signs and symptoms indicating need for more acute intervention. Patient verbalized understanding. After Visit Summary given.   SUBJECTIVE: History from: patient. Kevin Craig is a 30 y.o. male who presents with complaint of intermittent epigastric abdominal pain; "severe yesterday; has eased off some today". Onset abrupt, 1-2 d ago. Discomfort described as aching and dull; without radiation; trouble sleeping last evening secondary to pain. Reports normal flatus. Fever: absent. Aggravating factors: have not been identified. Alleviating factors: have not been identified. Associated symptoms: nausea without emesis. He denies diarrhea and headache. Belching more frequently. Appetite: decreased. PO intake: decreased. Ambulatory without assistance. Urinary symptoms: none. Bowel movements: have not significantly changed. History of similar: no. OTC treatment: none PTA.  Reports approx 1 pint/day liquor; long-time use. Denies drug use.  Past Surgical History:  Procedure Laterality Date  . TONSILLECTOMY    . tubes in ears    . WISDOM TOOTH EXTRACTION     Increased blood pressure noted  today. Reports that he has not been treated for hypertension in the past.  He reports no chest pain on exertion, no dyspnea on exertion, no orthostatic dizziness or lightheadedness, no orthopnea or paroxysmal nocturnal dyspnea and no palpitations.   OBJECTIVE:  Vitals:   09/20/20 1827  BP: (!) 154/90  Pulse: 84  Resp: 18  Temp: 98.4 F (36.9 C)  TempSrc: Oral  SpO2: 97%    General appearance:  alert, oriented, no acute distress but appears uncomfortable HEENT: [place]; AT; oropharynx moist Lungs: unlabored respirations Abdomen: soft; without distention; mild  and poorly localized tenderness to palpation over epigastric area; without masses or organomegaly; without guarding or rebound tenderness Back: without reported CVA tenderness; FROM at waist Extremities: without LE edema; symmetrical; without gross deformities Skin: warm and dry Neurologic: normal gait Psychological: alert and cooperative; normal mood and affect   Investigations Pending: Labs Reviewed  CBC WITH DIFFERENTIAL/PLATELET   Narrative:    Performed at:  [address] [facility] [address], [place], [place]  [id number] Lab Director: Jolene Schimke MD, Phone:  [phone]  COMPREHENSIVE METABOLIC PANEL   Narrative:    Performed at:  [address] [place] [address] [place], [place]  [id number] Lab Director: Jolene Schimke MD, Phone:  [phone]  LIPASE, BLOOD    Imaging: No results found.   No Known Allergies                                             Past Medical History:  Diagnosis Date  . ADHD (attention deficit hyperactivity disorder)   . Asthma     Social History   Socioeconomic History  . Marital status: Single    Spouse name: Not on file  . Number of children: 0  . Years of education: HS  . Highest education level: Not on file  Occupational History    Employer: [employer]  Tobacco Use  . Smoking status: Current Every Day Smoker    Packs/day: 0.50    Types: Cigarettes  . Smokeless tobacco: Former Clinical biochemist  . Vaping Use: Never used  Substance and Sexual Activity  . Alcohol use: Not on file    Comment: every other day  . Drug use: No  . Sexual activity: Not on file  Other Topics Concern  . Not on file  Social History Narrative   Pt lives at home alone.   Caffeine Use- 2 sodas daily   Social Determinants of Health   Financial Resource Strain: Not on file   Food Insecurity: Not on file  Transportation Needs: Not on file  Physical Activity: Not on file  Stress: Not on file  Social Connections: Not on file  Intimate Partner Violence: Not on file    Family History  Problem Relation Age of Onset  . Diabetes Maternal Grandmother   . Breast cancer Maternal Grandmother   . Diabetes Maternal Glynda Jaeger, MD 09/21/20 1010

## 2021-03-09 ENCOUNTER — Encounter: Payer: Self-pay | Admitting: Urology

## 2021-03-09 ENCOUNTER — Ambulatory Visit: Payer: 59 | Admitting: Urology

## 2021-03-09 ENCOUNTER — Other Ambulatory Visit: Payer: Self-pay

## 2021-03-09 VITALS — BP 161/70 | HR 86

## 2021-03-09 DIAGNOSIS — N453 Epididymo-orchitis: Secondary | ICD-10-CM | POA: Diagnosis not present

## 2021-03-09 DIAGNOSIS — N5089 Other specified disorders of the male genital organs: Secondary | ICD-10-CM | POA: Diagnosis not present

## 2021-03-09 DIAGNOSIS — N50812 Left testicular pain: Secondary | ICD-10-CM | POA: Diagnosis not present

## 2021-03-09 LAB — URINALYSIS, ROUTINE W REFLEX MICROSCOPIC
Bilirubin, UA: NEGATIVE
Glucose, UA: NEGATIVE
Ketones, UA: NEGATIVE
Leukocytes,UA: NEGATIVE
Nitrite, UA: NEGATIVE
Protein,UA: NEGATIVE
Specific Gravity, UA: 1.025 (ref 1.005–1.030)
Urobilinogen, Ur: 0.2 mg/dL (ref 0.2–1.0)
pH, UA: 6 (ref 5.0–7.5)

## 2021-03-09 LAB — MICROSCOPIC EXAMINATION
Bacteria, UA: NONE SEEN
Epithelial Cells (non renal): NONE SEEN /hpf (ref 0–10)
RBC, Urine: NONE SEEN /hpf (ref 0–2)
Renal Epithel, UA: NONE SEEN /hpf
WBC, UA: NONE SEEN /hpf (ref 0–5)

## 2021-03-09 MED ORDER — DOXYCYCLINE HYCLATE 100 MG PO CAPS: 100.0000 mg | ORAL_CAPSULE | Freq: Two times a day (BID) | ORAL | 0 refills | Status: AC

## 2021-03-09 NOTE — Progress Notes (Signed)
Assessment: 1. Pain in left testicle   2. Scrotal mass, left   3. Orchitis and epididymitis      Plan: Diagnosis and management of epididymitis discussed with the patient. Recommend course of doxycycline 100 mg twice daily x10 days. Ibuprofen as needed. Schedule for scrotal ultrasound for further evaluation. Patient reassured that this is likely a benign condition. Return to office in 3 weeks.  Chief Complaint:  Chief Complaint  Patient presents with   Testicle Pain   scrotal mass    History of Present Illness:  Kevin Craig is a 30 y.o. year old male who is seen for evaluation of a left scrotal mass and scrotal discomfort.  He noted onset of left scrotal pain approximately 2 weeks ago.  This began after lifting and straining at work.  He had radiation of pain into the left groin area.  He reports that the left scrotum was tender to touch.  He did not have any significant erythema or edema.  Since that time, he has noted a "knot" on the upper portion of the left testicle.  This is tender to touch.  He does have intermittent left scrotal pain at the present time.  He is taking ibuprofen with some benefit.  No history of scrotal trauma.  No history of undescended testicle.  He is not having any lower urinary tract symptoms.  No dysuria or gross hematuria..   Past Medical History:  Past Medical History:  Diagnosis Date   ADHD (attention deficit hyperactivity disorder)    Asthma     Past Surgical History:  Past Surgical History:  Procedure Laterality Date   TONSILLECTOMY     tubes in ears     WISDOM TOOTH EXTRACTION      Allergies:  No Known Allergies  Family History:  Family History  Problem Relation Age of Onset   Diabetes Maternal Grandmother    Breast cancer Maternal Grandmother    Diabetes Maternal Grandfather     Social History:  Social History   Tobacco Use   Smoking status: Every Day    Packs/day: 0.50    Types: Cigarettes   Smokeless tobacco:  Former  Building services engineer Use: Never used  Substance Use Topics   Drug use: No    Review of symptoms:  Constitutional:  Negative for unexplained weight loss, night sweats, fever, chills ENT:  Negative for nose bleeds, sinus pain, painful swallowing CV:  Negative for chest pain, shortness of breath, exercise intolerance, palpitations, loss of consciousness Resp:  Negative for cough, wheezing, shortness of breath GI:  Negative for nausea, vomiting, diarrhea, bloody stools GU:  Positives noted in HPI; otherwise negative for gross hematuria, dysuria, urinary incontinence Neuro:  Negative for seizures, poor balance, limb weakness, slurred speech Psych:  Negative for lack of energy, depression, anxiety Endocrine:  Negative for polydipsia, polyuria, symptoms of hypoglycemia (dizziness, hunger, sweating) Hematologic:  Negative for anemia, purpura, petechia, prolonged or excessive bleeding, use of anticoagulants  Allergic:  Negative for difficulty breathing or choking as a result of exposure to anything; no shellfish allergy; no allergic response (rash/itch) to materials, foods  Physical exam: BP (!) 161/70   Pulse 86  GENERAL APPEARANCE:  Well appearing, well developed, well nourished, NAD HEENT: Atraumatic, Normocephalic, oropharynx clear. NECK: Supple without lymphadenopathy or thyromegaly. LUNGS: Clear to auscultation bilaterally. HEART: Regular Rate and Rhythm without murmurs, gallops, or rubs. ABDOMEN: Soft, non-tender, No Masses. EXTREMITIES: Moves all extremities well.  Without clubbing, cyanosis, or edema.  NEUROLOGIC:  Alert and oriented x 3, normal gait, CN II-XII grossly intact.  MENTAL STATUS:  Appropriate. BACK:  Non-tender to palpation.  No CVAT SKIN:  Warm, dry and intact.   GU: Penis:  no penile lesions or discharge, no testicular masses or tenderness Meatus: Normal Scrotum: No erythema or edema Testis: Right normal; left without palpable mass; small mobile nodule  superior aspect of left testicle ?  Appendix testis Epididymis: Right: normal, nontender; left: Fullness at epididymal head with some tenderness   Results: U/A: Dipstick negative

## 2021-03-09 NOTE — Progress Notes (Signed)
Urological Symptom Review  Patient is experiencing the following symptoms: none   Review of Systems  Gastrointestinal (upper)  : Negative for upper GI symptoms  Gastrointestinal (lower) : Negative for lower GI symptoms  Constitutional : Fatigue  Skin: Negative for skin symptoms  Eyes: Negative for eye symptoms  Ear/Nose/Throat : Negative for Ear/Nose/Throat symptoms  Hematologic/Lymphatic: Negative for Hematologic/Lymphatic symptoms  Cardiovascular : Negative for cardiovascular symptoms  Respiratory : Negative for respiratory symptoms  Endocrine: Negative for endocrine symptoms  Musculoskeletal: Back pain  Neurological: Negative for neurological symptoms  Psychologic: Negative for psychiatric symptoms

## 2021-03-20 ENCOUNTER — Ambulatory Visit (HOSPITAL_COMMUNITY): Admission: RE | Admit: 2021-03-20 | Payer: 59 | Source: Ambulatory Visit

## 2021-03-29 ENCOUNTER — Other Ambulatory Visit: Payer: Self-pay

## 2021-03-29 ENCOUNTER — Ambulatory Visit (HOSPITAL_COMMUNITY)
Admission: RE | Admit: 2021-03-29 | Discharge: 2021-03-29 | Disposition: A | Discharge status: Home / Self Care | Payer: 59 | Source: Ambulatory Visit | Attending: Urology | Admitting: Urology

## 2021-03-29 DIAGNOSIS — N50812 Left testicular pain: Secondary | ICD-10-CM | POA: Diagnosis present

## 2021-03-29 DIAGNOSIS — N5089 Other specified disorders of the male genital organs: Secondary | ICD-10-CM | POA: Insufficient documentation

## 2021-03-30 ENCOUNTER — Ambulatory Visit (INDEPENDENT_AMBULATORY_CARE_PROVIDER_SITE_OTHER): Payer: 59 | Admitting: Urology

## 2021-03-30 ENCOUNTER — Encounter: Payer: Self-pay | Admitting: Urology

## 2021-03-30 VITALS — BP 164/96 | HR 99

## 2021-03-30 DIAGNOSIS — N451 Epididymitis: Secondary | ICD-10-CM | POA: Diagnosis not present

## 2021-03-30 DIAGNOSIS — N50812 Left testicular pain: Secondary | ICD-10-CM | POA: Diagnosis not present

## 2021-03-30 MED ORDER — SULFAMETHOXAZOLE-TRIMETHOPRIM 800-160 MG PO TABS: 1.0000 | ORAL_TABLET | Freq: Two times a day (BID) | ORAL | 0 refills | Status: AC

## 2021-03-30 MED ORDER — MELOXICAM 7.5 MG PO TABS: 7.5000 mg | ORAL_TABLET | Freq: Every day | ORAL | 1 refills | Status: DC

## 2021-03-30 NOTE — Progress Notes (Signed)
Assessment: 1. Epididymitis, left   2. Pain in left testicle     Plan: I reviewed the patient's scrotal ultrasound images and results.  I discussed these findings with the patient in detail today.  The study is consistent with left epididymitis. Recommend course of Bactrim DS BID x 10 days. Rx for Mobic 7.5 mg daily sent  Return to office prn I recommended that he follow-up with his PCP for his elevated blood pressure.  Chief Complaint:  Chief Complaint  Patient presents with   scrotal mass     History of Present Illness:  Kevin Craig is a 30 y.o. year old male who is seen for further evaluation of a left scrotal mass and scrotal discomfort.   At his initial visit on 03/09/21, he noted onset of left scrotal pain approximately 2 weeks prior.  This began after lifting and straining at work.  He had radiation of pain into the left groin area.  He reported that the left scrotum was tender to touch.  He did not have any significant erythema or edema.  Since that time, he also noted a "knot" on the upper portion of the left testicle with was tender to touch.  He was having intermittent left scrotal pain at the time of his visit.  He was taking ibuprofen with some benefit.  No history of scrotal trauma.  No history of undescended testicle.  He is not having any lower urinary tract symptoms.  No dysuria or gross hematuria. Examination demonstrated tenderness of the left epididymis and a small palpable nodule at the superior pole of the left testicle.  He was treated with doxycycline x 10 days and ibuprofen as needed.  He was only able to take the doxycycline for 3 days due to GI side effects. Scrotal ultrasound from 03/29/2021 showed normal testes bilaterally, mild heterogeneous appearance of the left epididymis with increased vascularity in comparison to the right, trace bilateral hydroceles, bilateral varicoceles.  He returns today for follow-up.  He has noted some slight increase in  discomfort in the left scrotal area within the past few days.  No other changes noted. No LUTS.  He has 3 children.  Portions of the above documentation were copied from a prior visit for review purposes only.   Past Medical History:  Past Medical History:  Diagnosis Date   ADHD (attention deficit hyperactivity disorder)    Asthma     Past Surgical History:  Past Surgical History:  Procedure Laterality Date   TONSILLECTOMY     tubes in ears     WISDOM TOOTH EXTRACTION      Allergies:  No Known Allergies  Family History:  Family History  Problem Relation Age of Onset   Diabetes Maternal Grandmother    Breast cancer Maternal Grandmother    Diabetes Maternal Grandfather     Social History:  Social History   Tobacco Use   Smoking status: Every Day    Packs/day: 0.50    Types: Cigarettes   Smokeless tobacco: Former  Building services engineer Use: Never used  Substance Use Topics   Drug use: No    ROS: Constitutional:  Negative for fever, chills, weight loss CV: Negative for chest pain, previous MI, hypertension Respiratory:  Negative for shortness of breath, wheezing, sleep apnea, frequent cough GI:  Negative for nausea, vomiting, bloody stool, GERD  Physical exam: BP (!) 164/96   Pulse 99  GENERAL APPEARANCE:  Well appearing, well developed, well nourished, NAD HEENT:  Atraumatic, normocephalic, oropharynx clear NECK:  Supple without lymphadenopathy or thyromegaly ABDOMEN:  Soft, non-tender, no masses EXTREMITIES:  Moves all extremities well, without clubbing, cyanosis, or edema NEUROLOGIC:  Alert and oriented x 3, normal gait, CN II-XII grossly intact MENTAL STATUS:  appropriate BACK:  Non-tender to palpation, No CVAT SKIN:  Warm, dry, and intact  Results: None

## 2021-03-30 NOTE — Progress Notes (Signed)

## 2021-09-22 ENCOUNTER — Ambulatory Visit
Admission: EM | Admit: 2021-09-22 | Discharge: 2021-09-22 | Disposition: A | Discharge status: Home / Self Care | Payer: 59 | Attending: Student | Admitting: Student

## 2021-09-22 DIAGNOSIS — J0110 Acute frontal sinusitis, unspecified: Secondary | ICD-10-CM | POA: Diagnosis not present

## 2021-09-22 MED ORDER — AMOXICILLIN 875 MG PO TABS: 875.0000 mg | ORAL_TABLET | Freq: Two times a day (BID) | ORAL | 0 refills | Status: AC

## 2021-09-22 NOTE — ED Provider Notes (Signed)
?[facility] ? ? ? ?CSN: [id number] ?Arrival date & time: 09/22/21  1227 ? ? ?  ? ?History   ?Chief Complaint ?Chief Complaint  ?Patient presents with  ? Nasal Congestion  ? Facial Swelling  ? ? ?HPI ?Kevin Craig is a 31 y.o. male presenting with viral syndrome and progressively worsening facial pressure. History noncontributory. Describes nonproductive cough, nasal congestion.  Congestion is increasingly purulent with blood in it.  Pressure behind the right eye and right forehead.  Has attempted over-the-counter Mucinex without relief.  Denies shortness of breath, chest pain, fever/chills. ? ?HPI ? ?Past Medical History:  ?Diagnosis Date  ? ADHD (attention deficit hyperactivity disorder)   ? Asthma   ? ? ?Patient Active Problem List  ? Diagnosis Date Noted  ? Left facial numbness 08/22/2012  ? ? ?Past Surgical History:  ?Procedure Laterality Date  ? TONSILLECTOMY    ? tubes in ears    ? WISDOM TOOTH EXTRACTION    ? ? ? ? ? ?Home Medications   ? ?Prior to Admission medications   ?Medication Sig Start Date End Date Taking? Authorizing Provider  ?amoxicillin (AMOXIL) 875 MG tablet Take 1 tablet (875 mg total) by mouth 2 (two) times daily for 7 days. 09/22/21 09/29/21 Yes Rhys Martini, PA-C  ?albuterol (VENTOLIN HFA) 108 (90 Base) MCG/ACT inhaler Inhale into the lungs every 6 (six) hours as needed for wheezing or shortness of breath.    [provider]  ?diclofenac sodium (VOLTAREN) 1 % GEL Apply 2 g topically 4 (four) times daily as needed. 10/23/18   Long, Arlyss Repress, MD  ?esomeprazole (NEXIUM) 40 MG capsule Take 1 capsule (40 mg total) by mouth daily. 09/20/20   Mardella Layman, MD  ?ibuprofen (ADVIL) 800 MG tablet Take 1 tablet (800 mg total) by mouth every 8 (eight) hours as needed for moderate pain. 10/23/18   Long, Arlyss Repress, MD  ?meloxicam (MOBIC) 7.5 MG tablet Take 1 tablet (7.5 mg total) by mouth daily. 03/30/21   Stoneking, Danford Bad., MD  ?Multiple Vitamins-Minerals (MENS MULTIVITAMIN PLUS PO)  Take 1 tablet by mouth daily.    [provider]  ?ondansetron (ZOFRAN-ODT) 4 MG disintegrating tablet Take 1 tablet (4 mg total) by mouth every 8 (eight) hours as needed for nausea or vomiting. 09/20/20   Mardella Layman, MD  ?sucralfate (CARAFATE) 1 g tablet Take 1 tablet (1 g total) by mouth 4 (four) times daily -  with meals and at bedtime. 09/20/20   Mardella Layman, MD  ? ? ?Family History ?Family History  ?Problem Relation Age of Onset  ? Diabetes Maternal Grandmother   ? Breast cancer Maternal Grandmother   ? Diabetes Maternal Grandfather   ? ? ?Social History ?Social History  ? ?Tobacco Use  ? Smoking status: Every Day  ?  Packs/day: 0.50  ?  Types: Cigarettes  ? Smokeless tobacco: Former  ?Vaping Use  ? Vaping Use: Never used  ?Substance Use Topics  ? Drug use: No  ? ? ? ?Allergies   ?Patient has no known allergies. ? ? ?Review of Systems ?Review of Systems  ?Constitutional:  Negative for appetite change, chills and fever.  ?HENT:  Positive for sinus pressure. Negative for congestion, ear pain, rhinorrhea, sinus pain and sore throat.   ?Eyes:  Negative for redness and visual disturbance.  ?Respiratory:  Negative for cough, chest tightness, shortness of breath and wheezing.   ?Cardiovascular:  Negative for chest pain and palpitations.  ?Gastrointestinal:  Negative for  abdominal pain, constipation, diarrhea, nausea and vomiting.  ?Genitourinary:  Negative for dysuria, frequency and urgency.  ?Musculoskeletal:  Negative for myalgias.  ?Neurological:  Negative for dizziness, weakness and headaches.  ?Psychiatric/Behavioral:  Negative for confusion.   ?All other systems reviewed and are negative. ? ? ?Physical Exam ?Triage Vital Signs ?ED Triage Vitals  ?Enc Vitals Group  ?   BP 09/22/21 1545 123/82  ?   Pulse Rate 09/22/21 1545 77  ?   Resp 09/22/21 1545 20  ?   Temp 09/22/21 1545 98.4 ?F (36.9 ?C)  ?   Temp src --   ?   SpO2 09/22/21 1545 98 %  ?   Weight --   ?   Height --   ?   Head Circumference --   ?    Peak Flow --   ?   Pain Score 09/22/21 1543 8  ?   Pain Loc --   ?   Pain Edu? --   ?   Excl. in GC? --   ? ?No data found. ? ?Updated Vital Signs ?BP 123/82   Pulse 77   Temp 98.4 ?F (36.9 ?C)   Resp 20   SpO2 98%  ? ?Visual Acuity ?Right Eye Distance:   ?Left Eye Distance:   ?Bilateral Distance:   ? ?Right Eye Near:   ?Left Eye Near:    ?Bilateral Near:    ? ?Physical Exam ?Vitals reviewed.  ?Constitutional:   ?   General: He is not in acute distress. ?   Appearance: Normal appearance. He is not ill-appearing.  ?HENT:  ?   Head: Normocephalic and atraumatic.  ?   Right Ear: Tympanic membrane, ear canal and external ear normal. No tenderness. No middle ear effusion. There is no impacted cerumen. Tympanic membrane is not perforated, erythematous, retracted or bulging.  ?   Left Ear: Tympanic membrane, ear canal and external ear normal. No tenderness.  No middle ear effusion. There is no impacted cerumen. Tympanic membrane is not perforated, erythematous, retracted or bulging.  ?   Nose: No congestion.  ?   Right Sinus: Frontal sinus tenderness present. No maxillary sinus tenderness.  ?   Left Sinus: Frontal sinus tenderness present. No maxillary sinus tenderness.  ?   Mouth/Throat:  ?   Mouth: Mucous membranes are moist.  ?   Pharynx: Uvula midline. No oropharyngeal exudate or posterior oropharyngeal erythema.  ?Eyes:  ?   Extraocular Movements: Extraocular movements intact.  ?   Pupils: Pupils are equal, round, and reactive to light.  ?Cardiovascular:  ?   Rate and Rhythm: Normal rate and regular rhythm.  ?   Heart sounds: Normal heart sounds.  ?Pulmonary:  ?   Effort: Pulmonary effort is normal.  ?   Breath sounds: Normal breath sounds. No decreased breath sounds, wheezing, rhonchi or rales.  ?Abdominal:  ?   Palpations: Abdomen is soft.  ?   Tenderness: There is no abdominal tenderness. There is no guarding or rebound.  ?Lymphadenopathy:  ?   Cervical: No cervical adenopathy.  ?   Right cervical: No  superficial cervical adenopathy. ?   Left cervical: No superficial cervical adenopathy.  ?Neurological:  ?   General: No focal deficit present.  ?   Mental Status: He is alert and oriented to person, place, and time.  ?Psychiatric:     ?   Mood and Affect: Mood normal.     ?   Behavior: Behavior normal.     ?  Thought Content: Thought content normal.     ?   Judgment: Judgment normal.  ? ? ? ?UC Treatments / Results  ?Labs ?(all labs ordered are listed, but only abnormal results are displayed) ?Labs Reviewed - No data to display ? ?EKG ? ? ?Radiology ?No results found. ? ?Procedures ?Procedures (including critical care time) ? ?Medications Ordered in UC ?Medications - No data to display ? ?Initial Impression / Assessment and Plan / UC Course  ?I have reviewed the triage vital signs and the nursing notes. ? ?Pertinent labs & imaging results that were available during my care of the patient were reviewed by me and considered in my medical decision making (see chart for details). ? ?  ? ?This patient is a very pleasant 31 y.o. year old male presenting with increasing sinus pressure and purulent nasal congestion following viral syndrome x7 days. Afebrile, nontachy. Will manage as sinusitis with amoxicillin as below. ED return precautions discussed. Patient verbalizes understanding and agreement.  ?.  ? ?Final Clinical Impressions(s) / UC Diagnoses  ? ?Final diagnoses:  ?Acute non-recurrent frontal sinusitis  ? ? ? ?Discharge Instructions   ? ?  ?-Amoxicillin twice daily x7 days. Take with food if sensitive stomach. ?-Continue over-the-counter medications for additional relief. ? ? ?ED Prescriptions   ? ? Medication Sig Dispense Auth. Provider  ? amoxicillin (AMOXIL) 875 MG tablet Take 1 tablet (875 mg total) by mouth 2 (two) times daily for 7 days. 14 tablet Rhys MartiniGraham, Kalanie Fewell E, PA-C  ? ?  ? ?PDMP not reviewed this encounter. ?  ?Rhys MartiniGraham, Brandom Kerwin E, PA-C ?09/22/21 1636 ? ?

## 2021-09-22 NOTE — ED Triage Notes (Signed)
Pt  presents with nasal congestion, cough and scratchy throat , also has facial swelling and pressure  ?

## 2021-09-22 NOTE — Discharge Instructions (Addendum)
-  Amoxicillin twice daily x7 days. Take with food if sensitive stomach. ?-Continue over-the-counter medications for additional relief. ?

## 2022-04-27 ENCOUNTER — Emergency Department (HOSPITAL_COMMUNITY): Payer: 59

## 2022-04-27 ENCOUNTER — Other Ambulatory Visit: Payer: Self-pay

## 2022-04-27 ENCOUNTER — Encounter (HOSPITAL_COMMUNITY): Payer: Self-pay

## 2022-04-27 DIAGNOSIS — R0602 Shortness of breath: Secondary | ICD-10-CM | POA: Diagnosis not present

## 2022-04-27 DIAGNOSIS — F17200 Nicotine dependence, unspecified, uncomplicated: Secondary | ICD-10-CM | POA: Insufficient documentation

## 2022-04-27 DIAGNOSIS — F1020 Alcohol dependence, uncomplicated: Secondary | ICD-10-CM | POA: Diagnosis not present

## 2022-04-27 DIAGNOSIS — R072 Precordial pain: Secondary | ICD-10-CM | POA: Diagnosis present

## 2022-04-27 LAB — TROPONIN I (HIGH SENSITIVITY): Troponin I (High Sensitivity): 2 ng/L (ref <18)

## 2022-04-27 LAB — CBC
HCT: 44.3 % (ref 39.0–52.0)
Hemoglobin: 15.2 g/dL (ref 13.0–17.0)
MCH: 32.3 pg (ref 26.0–34.0)
MCHC: 34.3 g/dL (ref 30.0–36.0)
MCV: 94.1 fL (ref 80.0–100.0)
Platelets: 207 10*3/uL (ref 150–400)
RBC: 4.71 MIL/uL (ref 4.22–5.81)
RDW: 13.2 % (ref 11.5–15.5)
WBC: 8.6 10*3/uL (ref 4.0–10.5)
nRBC: 0 % (ref 0.0–0.2)

## 2022-04-27 LAB — BASIC METABOLIC PANEL
Anion gap: 9 (ref 5–15)
BUN: 15 mg/dL (ref 6–20)
CO2: 25 mmol/L (ref 22–32)
Calcium: 9.3 mg/dL (ref 8.9–10.3)
Chloride: 104 mmol/L (ref 98–111)
Creatinine, Ser: 1.01 mg/dL (ref 0.61–1.24)
GFR, Estimated: >60 mL/min (ref >60)
Glucose, Bld: 96 mg/dL (ref 70–99)
Potassium: 3.5 mmol/L (ref 3.5–5.1)
Sodium: 138 mmol/L (ref 135–145)

## 2022-04-27 NOTE — ED Triage Notes (Signed)
Pt c/o pain to L chest x 3-4 days. Pain is described as pressure and burning. Pain is worse with certain movements and deep inspiration. Pain occasionally radiates to L shoulder. Pt reports associated ShOB and numbness in bilateral hands that is intermittent.

## 2022-04-28 ENCOUNTER — Emergency Department (HOSPITAL_COMMUNITY)
Admission: EM | Admit: 2022-04-28 | Discharge: 2022-04-28 | Disposition: A | Discharge status: Home / Self Care | Payer: 59 | Attending: Emergency Medicine | Admitting: Emergency Medicine

## 2022-04-28 DIAGNOSIS — R072 Precordial pain: Secondary | ICD-10-CM

## 2022-04-28 DIAGNOSIS — F1090 Alcohol use, unspecified, uncomplicated: Secondary | ICD-10-CM

## 2022-04-28 LAB — TROPONIN I (HIGH SENSITIVITY): Troponin I (High Sensitivity): <2 ng/L (ref <18)

## 2022-04-28 MED ORDER — IBUPROFEN 400 MG PO TABS
400.0000 mg | ORAL_TABLET | Freq: Once | ORAL | Status: AC
  Administered 2022-04-28: 400 mg via ORAL
  Filled 2022-04-28: qty 1

## 2022-04-28 MED ORDER — LORAZEPAM 1 MG PO TABS: ORAL_TABLET | ORAL | 0 refills | Status: AC

## 2022-04-28 MED ORDER — LORAZEPAM 1 MG PO TABS
1.0000 mg | ORAL_TABLET | Freq: Once | ORAL | Status: AC
  Administered 2022-04-28: 1 mg via ORAL
  Filled 2022-04-28: qty 1

## 2022-04-28 NOTE — ED Provider Notes (Signed)
[facility] EMERGENCY DEPARTMENT Provider Note   CSN: [id number] Arrival date & time: 04/27/22  2140     History  Chief Complaint  Patient presents with   Chest Pain    Kevin Craig is a 31 y.o. male.  The history is provided by the patient.  Chest Pain Associated symptoms: shortness of breath   Associated symptoms: no abdominal pain, no fever and no weakness   Patient presents with chest pain.  Patient reports about 3 days ago he was at work when he began having left-sided chest pain and pressure.  It has been constant since that time.  He reports at times he has pain in his left arm.  It hurts with movement and deep breathing.  There is no associated pain into his back.  He reports occasional numbness in his hands and feet with the chest pain.  No focal weakness.  He reports feeling shortness of breath. Patient denies known history of CAD/VTE.  Patient is an everyday tobacco user.  He reports he drinks a pint of liquor daily for several years.  His last drink was over 48 hours ago Denies any known history of seizures.  No tremors are reported He reports his job is mostly sedentary.  No heavy lifting or trauma    Home Medications Prior to Admission medications   Medication Sig Start Date End Date Taking? Authorizing Provider  LORazepam (ATIVAN) 1 MG tablet Take 1 tablet PO BID on day one, take 1 tablet PO BID on day 2, take 1 tablet PO on day 3 then STOP 04/28/22  Yes Zadie Rhine, MD  albuterol (VENTOLIN HFA) 108 (90 Base) MCG/ACT inhaler Inhale into the lungs every 6 (six) hours as needed for wheezing or shortness of breath.    [provider]      Allergies    Patient has no known allergies.    Review of Systems   Review of Systems  Constitutional:  Negative for fever.  Respiratory:  Positive for shortness of breath.   Cardiovascular:  Positive for chest pain.  Gastrointestinal:  Negative for abdominal pain and blood in stool.  Neurological:  Negative for  weakness.    Physical Exam Updated Vital Signs BP 132/78   Pulse 64   Temp 98 F (36.7 C) (Oral)   Resp 18   Ht 1.905 m (6\' 3" )   Wt 104.3 kg   SpO2 97%   BMI 28.75 kg/m  Physical Exam CONSTITUTIONAL: Well developed/well nourished, anxious HEAD: Normocephalic/atraumatic EYES: EOMI/PERRL ENMT: Mucous membranes moist NECK: supple no meningeal signs CV: S1/S2 noted, no murmurs/rubs/gallops noted LUNGS: Lungs are clear to auscultation bilaterally, no apparent distress Chest-tenderness to palpation noted ABDOMEN: soft, nontender, no rebound or guarding, bowel sounds noted throughout abdomen GU:no cva tenderness NEURO: Pt is awake/alert/appropriate, moves all extremitiesx4.  No facial droop.   No arm or leg drift.  Equal handgrips are noted.  No sensory deficit is noted in his extremities.  No tremor is noted in his extremities EXTREMITIES: pulses normal/equalx4, full ROM No calf tenderness or edema SKIN: warm, color normal PSYCH: Anxious ED Results / Procedures / Treatments   Labs (all labs ordered are listed, but only abnormal results are displayed) Labs Reviewed  BASIC METABOLIC PANEL  CBC  TROPONIN I (HIGH SENSITIVITY)  TROPONIN I (HIGH SENSITIVITY)    EKG EKG Interpretation  Date/Time:  Friday April 27 2022 21:51:55 EST Ventricular Rate:  72 PR Interval:  140 QRS Duration: 98 QT Interval:  374  QTC Calculation: 409 R Axis:   76 Text Interpretation: Normal sinus rhythm with sinus arrhythmia Normal ECG Confirmed by Zadie Rhine ([id number]) on 04/28/2022 12:11:32 AM  Radiology DG Chest 2 View  Result Date: 04/27/2022 CLINICAL DATA:  Chest pain for several days, initial encounter EXAM: CHEST - 2 VIEW COMPARISON:  10/23/2018 FINDINGS: The heart size and mediastinal contours are within normal limits. Both lungs are clear. The visualized skeletal structures are unremarkable. IMPRESSION: No active cardiopulmonary disease. Electronically Signed   By: Alcide Clever M.D.    On: 04/27/2022 22:19    Procedures Procedures    Medications Ordered in ED Medications  LORazepam (ATIVAN) tablet 1 mg (1 mg Oral Given 04/28/22 0059)  ibuprofen (ADVIL) tablet 400 mg (400 mg Oral Given 04/28/22 0059)    ED Course/ Medical Decision Making/ A&P Clinical Course as of 04/28/22 0159  Sat Apr 28, 2022  0121 Patient has reproducible chest pain.  His workup was overall unremarkable.  Low suspicion for ACS/PE/dissection.  Suspect he may be having some symptoms of mild alcohol withdrawal due to his daily alcohol use.  Otherwise patient is safe for discharge home, he has PCP follow-up in 2 days [DW]  0159 Patient feels improved.  No acute distress.  He is interested in trialing a taper dose of Ativan for his alcohol withdrawal.  He is safe for outpatient management.  He has PCP follow-up early next week [DW]    Clinical Course User Index [DW] Zadie Rhine, MD           HEART Score: 1                Medical Decision Making Amount and/or Complexity of Data Reviewed Labs: ordered. Radiology: ordered.  Risk Prescription drug management.   This patient presents to the ED for concern of chest pain, this involves an extensive number of treatment options, and is a complaint that carries with it a high risk of complications and morbidity.  The differential diagnosis includes but is not limited to acute coronary syndrome, aortic dissection, pulmonary embolism, pericarditis, pneumothorax, pneumonia, myocarditis, pleurisy, esophageal rupture    Comorbidities that complicate the patient evaluation: Patient's presentation is complicated by their history of tobacco use  Social Determinants of Health: Patient's  tobacco use and alcohol use disorder   increases the complexity of managing their presentation  Additional history obtained: Additional history obtained from significant other  Lab Tests: I Ordered, and personally interpreted labs.  The pertinent results include: Labs  are unremarkable  Imaging Studies ordered: I ordered imaging studies including X-ray chest   I independently visualized and interpreted imaging which showed no acute findings I agree with the radiologist interpretation   Medicines ordered and prescription drug management: I ordered medication including Ativan and ibuprofen for anxiety and pain Reevaluation of the patient after these medicines showed that the patient    improved  Test Considered: Patient is low risk / negative by heart score, therefore do not feel that cardiac admission is indicated. Patient appears PERC negative, no further evaluation of PE  Reevaluation: After the interventions noted above, I reevaluated the patient and found that they have :improved  Complexity of problems addressed: Patient's presentation is most consistent with  acute presentation with potential threat to life or bodily function  Disposition: After consideration of the diagnostic results and the patient's response to treatment,  I feel that the patent would benefit from discharge   .  Final Clinical Impression(s) / ED Diagnoses Final diagnoses:  Precordial pain  Alcohol use disorder    Rx / DC Orders ED Discharge Orders          Ordered    LORazepam (ATIVAN) 1 MG tablet        04/28/22 0158              Zadie Rhine, MD 04/28/22 0159

## 2022-04-28 NOTE — Discharge Instructions (Signed)
Substance Abuse Treatment Programs ° °Intensive Outpatient Programs °[facility]     °[address]      °[place], [place]                   °[phone]      ° °[facility] °[address] °[place], [place] °[phone] ° ° Behavioral Health Outpatient     °(Inpatient and outpatient)     °[address]           °[phone]   ° °[facility] °[phone] (Suboxone and Methadone) ° °[address]      °[place], [place] [address]      °[phone]      ° °[address] °[place], [place] °[phone] ° °[facility] (Outpatient/Inpatient, Chemical)    °(insurance only) [phone]      °       °[facility] (Groups & Residential) °[place], [place] °[phone] ° °   °[facility]     °[address]     °[place], [place]      °[phone]      ° °[facility] (for caregivers and family) °[address] °[place], [place] °336-299-4655 ° ° ° ° ° °Residential Treatment Programs °[facility]      °[address], [place], [place] [address]  °([phone]      ° °[facility] °[address] [place], [place] [address] °[phone] ° °[facility]        °336-248-8914      ° °[facility] °[phone] ° °[facility]             °[address]                                         °[place], [place]                                                °[phone] or [phone]                              ° °[facility] [place] °[address] °[place], [address] °[phone] ° °[facility]    °[address]      °[place], [place]     °[phone]      ° °[facility] °[address] °[place], [place] °[phone] ° °[facility]   °[address]     °[place], [place] [address]     °[phone]      °Admissions: 8am-3pm M-F ° °[facility] °[address] °[place],  [place] °336-227-7417 ° °[facility]: Residential Program (90 Days)   °[place], [place]      °336-725-8389 or [phone]    ° °[facility] °[place], [place] °(Walk in Hours over the weekend or by referral) ° °[facility] °[address], [place], [place] [address] °([phone] ° °Crisis Mobile: [facility]:  1-877-626-1772 (for crisis response 24 hours a day) °[facility] Hotline:      1-800-256-2452 °Outpatient Psychiatry and Counseling ° °[facility]: Mobile Crisis   Management 24 hours:  1-877-626-1772 ° °[facility] sliding scale fee and walk in schedule: M-F 8am-12pm/1pm-3pm °[address]  °[place], [place] [address] °[phone] ° °[facility] °[address] °[place], [place] [address] °[phone] ° °[facility] (Formerly known [facility])- new patient walk-in appointments available Monday - Friday 8am -3pm.          °[address] °[place], [place] [address] °336-676-6840 or crisis line- 336-676-6905 ° °[facility]/ Intensive Outpatient Therapy Program °[address] °[place], [place] [address] °336-832-9804 ° °[facility]                  °Crisis Services      °[phone]      °[address]     °[place], [place] [address]                ° °[facility]   °[facility] °[phone] °[address] °[place], [place] [address] ° ° °[facility]          °[address]  °[place], [place] [address]       °([phone] ° °[facility] °[address] °[place],  [place] [address] °336-292-1510 ° °[facility]    °[address]    °[place], [place] [address]     °[phone]      ° °Parish McKinney, MD     °[address]     °[place] [address]     °336-282-1251     °  °[facility] °[address] °[place] [address] ° °[facility]     °[address]     °[place], [place]      °336-542-2076      ° °[facility] °Shamsher Ahluwalia, MD °[address] °[place], [place] [address] °[phone] ° °[facility]     °[address]     °[place], [place] [address]     °336-274-1237      ° °[facility] °[address] °[place], [place] [address] °336-854-4450 °Resources for Temporary Residential Assistance/Crisis Centers ° °DAY CENTERS °[facility] °M-F 8am-3pm   °[address] [place], [place] [address]   336-332-0824 °Services include: laundry, barbering, support groups, case management, phone  & computer access, showers, AA/NA mtgs, mental health/substance abuse nurse, job skills class, disability information, VA assistance, spiritual classes, etc.  ° °HOMELESS SHELTERS ° °[facility]     °[facility]   °[address], [place]     °[phone]       °       °[facility] (women and children)       °[address] °[place], [place] [address] °336-275-0820 °[email or web address] for application and process °Application Required ° °[facility]   °[address]    °[place] [address]     °[phone]       °             °[facility] °[address] °, [place] [address] °336.273.5572 °336-235-0363(schedule application appt.) °Application Required ° °[facility] (women only)    °[address]     °[place],  [address]     °336-884-1039      °  Intake starts 6pm daily °Need valid ID, SSC, & Police report °[facility] [place] °[address] °[place], [place] °336-881-5420 °Application Required ° °[facility] (men only)     °[address]      °[place], [place]     °[phone]      ° °[facility] °(Pregnant women only) °[address] °[place], [place] °336-275-0206 ° °[facility]      °[address]      °[place], [place] [address]     °[phone]      °       °[facility] °[address] °[place], [place] °336-723-1848 °90 day commitment/SA/Application process ° °[facility](men only)     °[address]     °[place], [place]     °[phone]       °Check-in at 7pm     °       °[facility] °[address] °[place], [place] [address] °336-248-6684 °Men/Women/Women and Children must be there by 7 pm ° °[facility] °[place], [place] °336-722-8721                ° °

## 2022-06-06 ENCOUNTER — Ambulatory Visit (INDEPENDENT_AMBULATORY_CARE_PROVIDER_SITE_OTHER): Payer: 59 | Admitting: Urology

## 2022-06-06 ENCOUNTER — Encounter: Payer: Self-pay | Admitting: Urology

## 2022-06-06 VITALS — BP 134/84 | Temp 77.0°F

## 2022-06-06 DIAGNOSIS — Z87442 Personal history of urinary calculi: Secondary | ICD-10-CM

## 2022-06-06 DIAGNOSIS — N200 Calculus of kidney: Secondary | ICD-10-CM

## 2022-06-06 LAB — URINALYSIS, ROUTINE W REFLEX MICROSCOPIC
Bilirubin, UA: NEGATIVE
Glucose, UA: NEGATIVE
Ketones, UA: NEGATIVE
Leukocytes,UA: NEGATIVE
Nitrite, UA: NEGATIVE
Protein,UA: NEGATIVE
RBC, UA: NEGATIVE
Specific Gravity, UA: 1.02 (ref 1.005–1.030)
Urobilinogen, Ur: 0.2 mg/dL (ref 0.2–1.0)
pH, UA: 6 (ref 5.0–7.5)

## 2022-06-06 NOTE — Patient Instructions (Signed)

## 2022-06-06 NOTE — Progress Notes (Signed)
06/06/2022 10:45 AM   Kevin Craig 06/06/1990 [id number]  Referring provider: No referring provider defined for this encounter.  Left flank pain   HPI: Kevin Craig is a 32yo here for evaluation of nephrolithiasis. He was seen at [facility] a week ago with left flank pain and was diagnosed with a 64mm left ureteral calculus. He passed the calculus this morning and brought a picture of it today. He has a family history of stones. This is his first stone event. He drinks 100oz of water daily.  He was told he has bilateral 1-20mm renal calculi. He drinks a pint of whiskey nightly.    PMH: Past Medical History:  Diagnosis Date   ADHD (attention deficit hyperactivity disorder)    Asthma     Surgical History: Past Surgical History:  Procedure Laterality Date   TONSILLECTOMY     tubes in ears     WISDOM TOOTH EXTRACTION      Home Medications:  Allergies as of 06/06/2022   No Known Allergies      Medication List        Accurate as of June 06, 2022 10:45 AM. If you have any questions, ask your nurse or doctor.          albuterol 108 (90 Base) MCG/ACT inhaler Commonly known as: VENTOLIN HFA Inhale into the lungs every 6 (six) hours as needed for wheezing or shortness of breath.   LORazepam 1 MG tablet Commonly known as: Ativan Take 1 tablet PO BID on day one, take 1 tablet PO BID on day 2, take 1 tablet PO on day 3 then STOP        Allergies: No Known Allergies  Family History: Family History  Problem Relation Age of Onset   Diabetes Maternal Grandmother    Breast cancer Maternal Grandmother    Diabetes Maternal Grandfather     Social History:  reports that he has been smoking cigarettes. He has been smoking an average of .5 packs per day. He has quit using smokeless tobacco. He reports current alcohol use. He reports current drug use. Drug: Marijuana.  ROS: All other review of systems were reviewed and are negative except what is noted above in  HPI  Physical Exam: BP 134/84   Temp (!) 77 F (25 C)   Constitutional:  Alert and oriented, No acute distress. HEENT: [place] AT, moist mucus membranes.  Trachea midline, no masses. Cardiovascular: No clubbing, cyanosis, or edema. Respiratory: Normal respiratory effort, no increased work of breathing. GI: Abdomen is soft, nontender, nondistended, no abdominal masses GU: No CVA tenderness.  Lymph: No cervical or inguinal lymphadenopathy. Skin: No rashes, bruises or suspicious lesions. Neurologic: Grossly intact, no focal deficits, moving all 4 extremities. Psychiatric: Normal mood and affect.  Laboratory Data: Lab Results  Component Value Date   WBC 8.6 04/27/2022   HGB 15.2 04/27/2022   HCT 44.3 04/27/2022   MCV 94.1 04/27/2022   PLT 207 04/27/2022    Lab Results  Component Value Date   CREATININE 1.01 04/27/2022    No results found for: "PSA"  No results found for: "TESTOSTERONE"  No results found for: "HGBA1C"  Urinalysis    Component Value Date/Time   COLORURINE YELLOW 06/18/2014 0849   APPEARANCEUR Clear 03/09/2021 1338   LABSPEC >1.030 (H) 06/18/2014 0849   PHURINE 6.0 06/18/2014 0849   GLUCOSEU Negative 03/09/2021 [place] (A) 06/18/2014 [place] Negative 03/09/2021 [place] 06/18/2014 0849  PROTEINUR Negative 03/09/2021 [place] 06/18/2014 0849   UROBILINOGEN 0.2 06/18/2014 0849   NITRITE Negative 03/09/2021 1338   NITRITE NEGATIVE 06/18/2014 0849   LEUKOCYTESUR Negative 03/09/2021 1338    Lab Results  Component Value Date   LABMICR See below: 03/09/2021   WBCUA None seen 03/09/2021   LABEPIT None seen 03/09/2021   BACTERIA None seen 03/09/2021    Pertinent Imaging:  No results found for this or any previous visit.  No results found for this or any previous visit.  No results found for this or any previous visit.  No results found for this or any previous visit.  No results found for  this or any previous visit.  No valid procedures specified. No results found for this or any previous visit.  No results found for this or any previous visit.   Assessment & Plan:    1. Kidney stones -Dietary handout given -followup 4 weeks with a renal US - Urinalysis, Routine w reflex microscopic    No follow-ups on file.  Nicolette Bang, MD  [facility] [place]

## 2022-07-03 ENCOUNTER — Ambulatory Visit (HOSPITAL_COMMUNITY)
Admission: RE | Admit: 2022-07-03 | Discharge: 2022-07-03 | Disposition: A | Discharge status: Home / Self Care | Payer: 59 | Source: Ambulatory Visit | Attending: Urology | Admitting: Urology

## 2022-07-03 DIAGNOSIS — N200 Calculus of kidney: Secondary | ICD-10-CM | POA: Insufficient documentation

## 2022-08-10 ENCOUNTER — Ambulatory Visit: Payer: 59 | Admitting: Urology

## 2023-02-05 IMAGING — US US SCROTUM W/ DOPPLER COMPLETE
1 series · 13 of 25 positions shown · non-contrast
Comparison: None.

CLINICAL DATA: left scrotal pain, mass

EXAM:
SCROTAL ULTRASOUND
DOPPLER ULTRASOUND OF THE TESTICLES
TECHNIQUE: Complete ultrasound examination of the testicles, epididymis, and
other scrotal structures was performed. Color and spectral Doppler
ultrasound were also utilized to evaluate blood flow to the
testicles.

[Series 1: us scrotum w/doppler · 73 acquisitions, 13 frames shown]
[im 1/73]
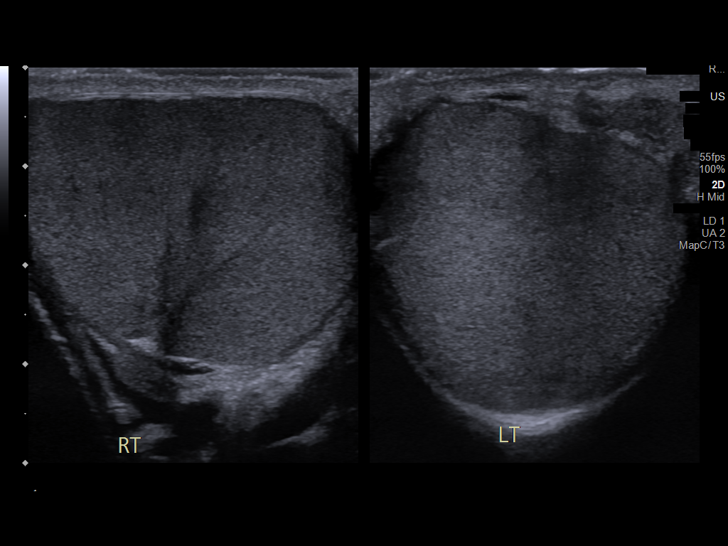
[im 7/73]
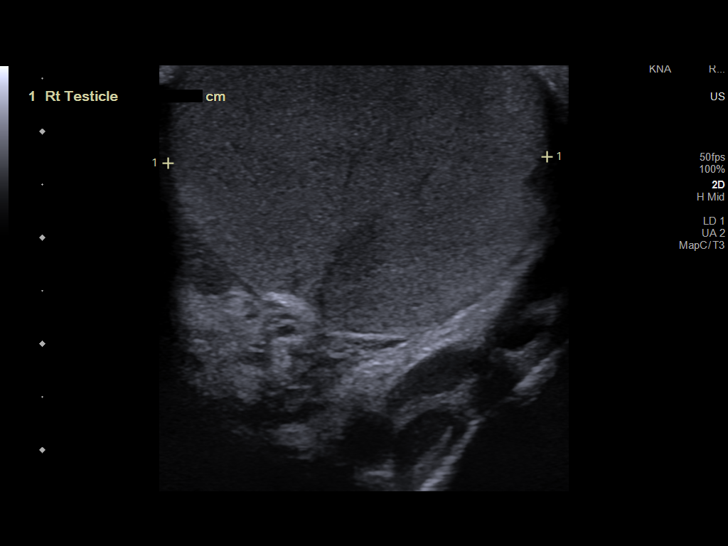
[im 13/73]
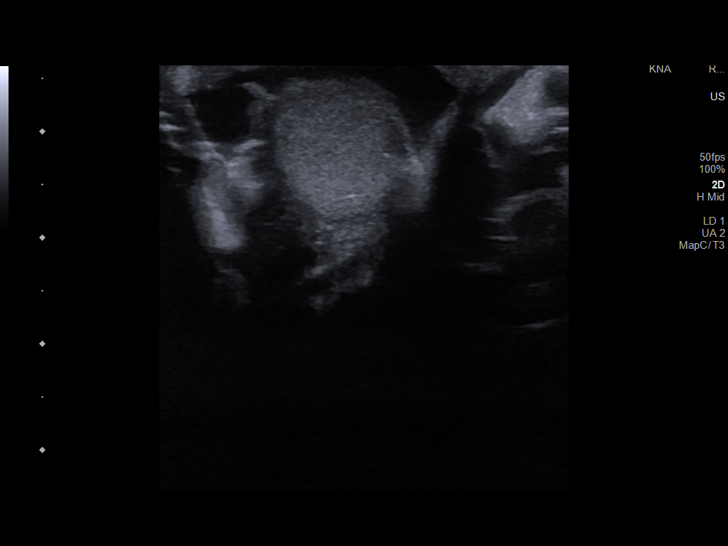
[im 19/73]
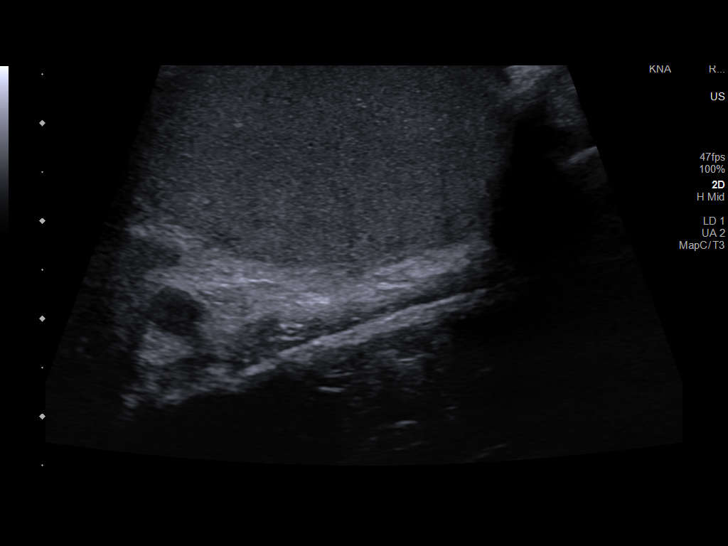
[im 25/73]
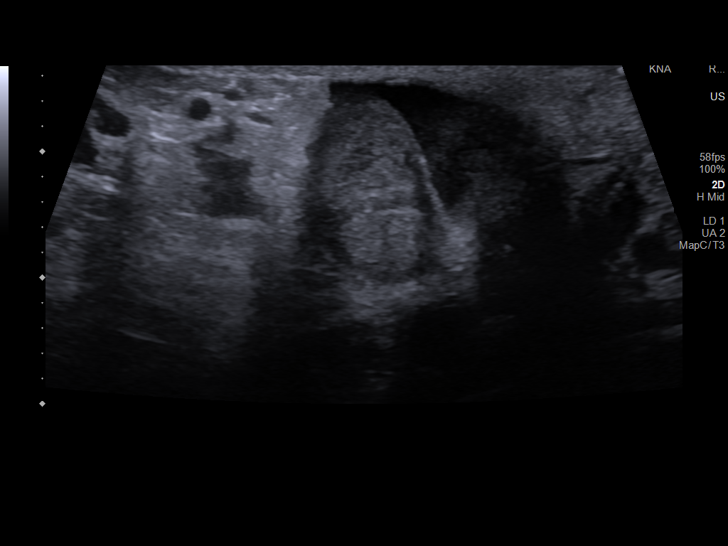
[im 31/73]
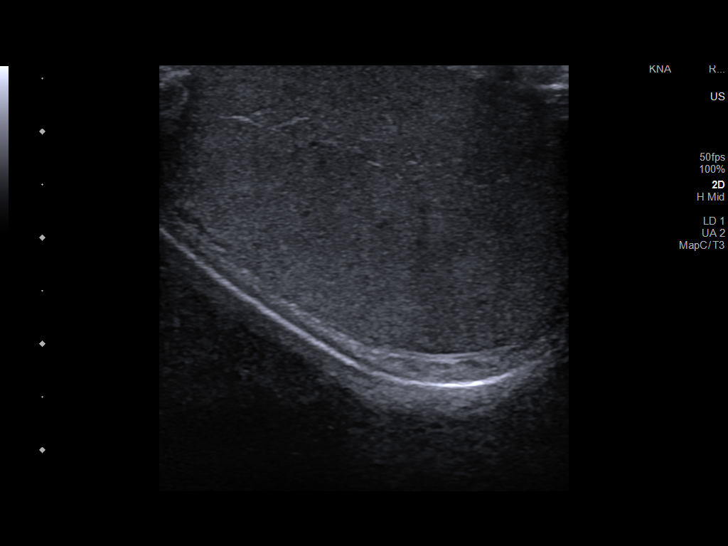
[im 37/73]
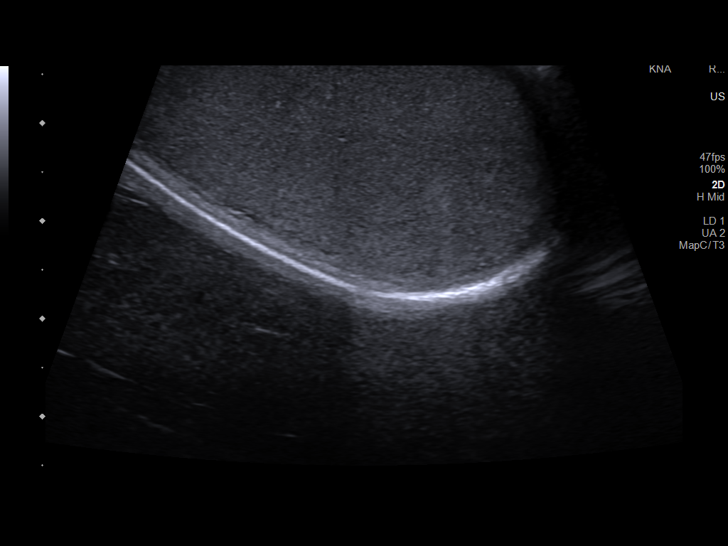
[im 43/73]
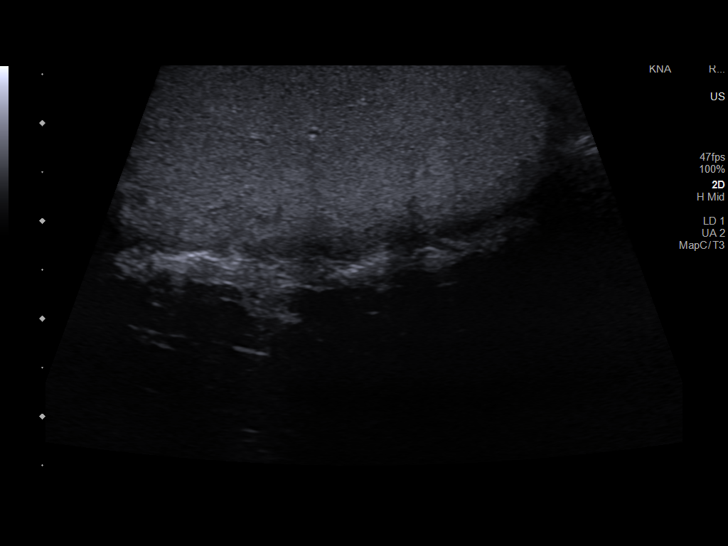
[im 49/73]
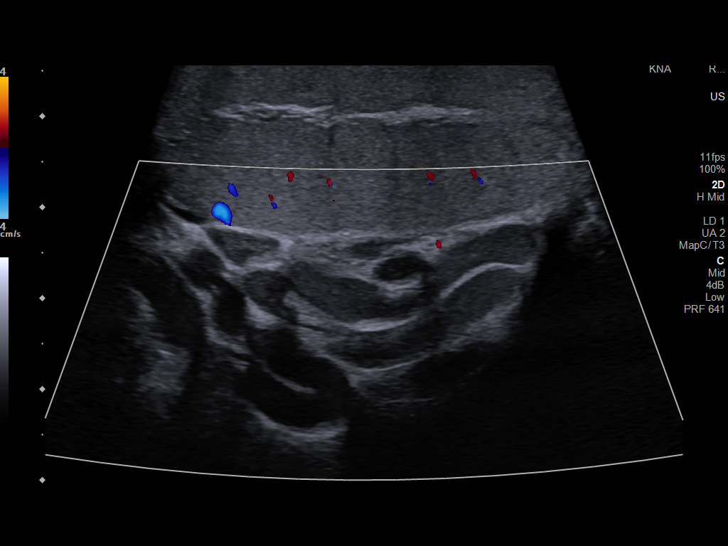
[im 55/73]
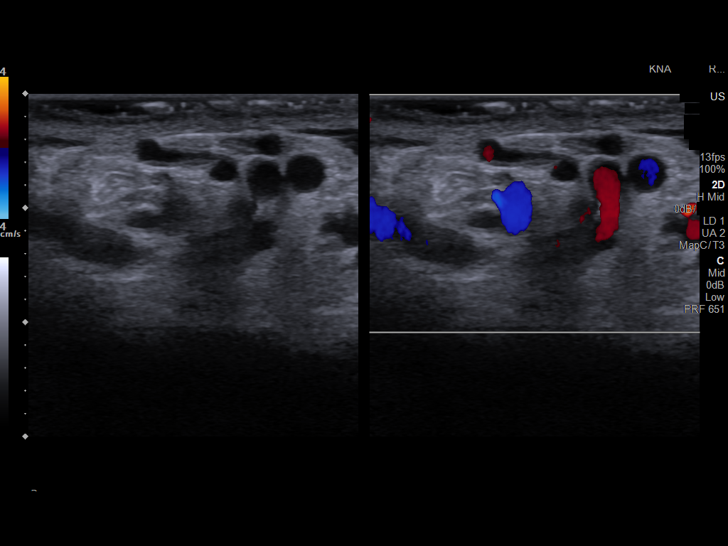
[im 61/73]
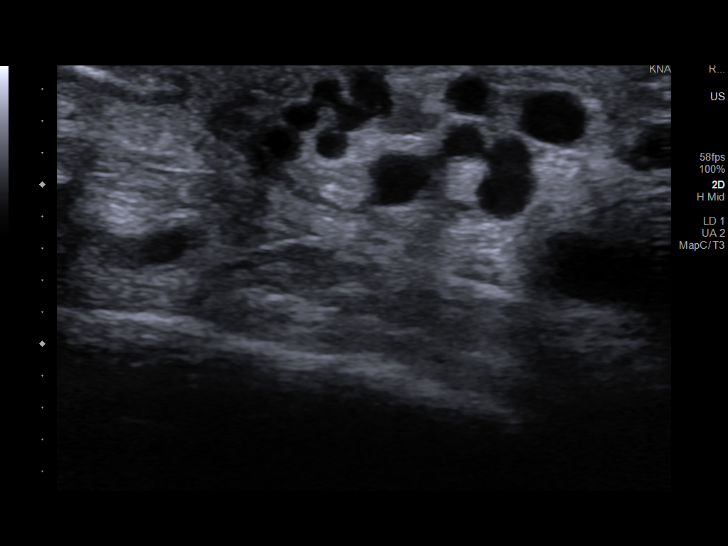
[im 67/73]
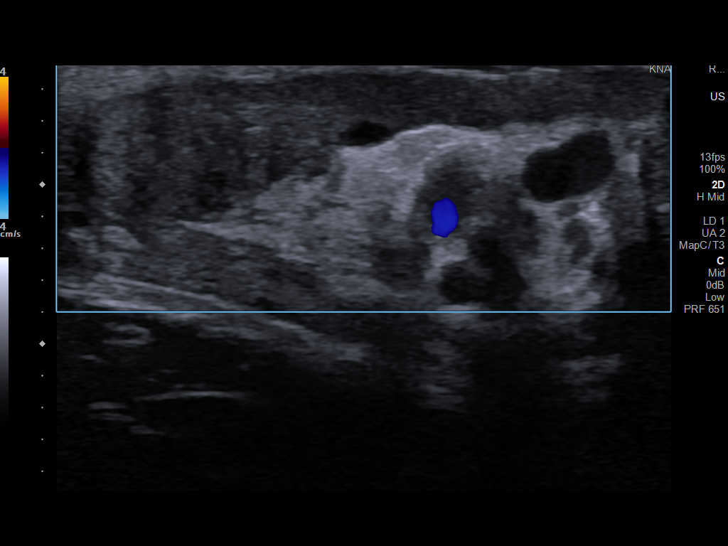
[im 73/73]
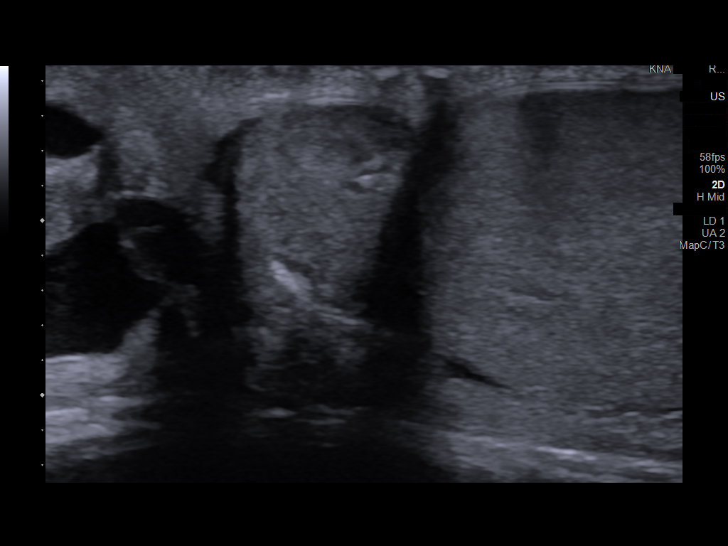

[13 of 25 positions shown; findings below may reference images not displayed]

FINDINGS: Right testicle

Measurements: 5.4 x 2.7 x 3.6 cm. No mass or microlithiasis
visualized.

Left testicle

Measurements: 5.6 x 2.4 x 4.6 cm. No mass or microlithiasis
visualized.

Right epididymis:  Normal in size and appearance.

Left epididymis: Mildly heterogeneous with slightly increased
vascularity in comparison to the right epididymis.

Hydrocele:  Trace bilateral hydroceles.

Varicocele: Bilateral varicoceles measuring up to 4.8 mm on the
left.

Pulsed Doppler interrogation of both testes demonstrates normal low
resistance arterial and venous waveforms bilaterally.
IMPRESSION: Bilateral varicoceles largest measuring up to 4.8 mm on the left.

Mild heterogeneity and slightly increased vascularity of the left
epididymis in comparison to the right epididymis. This could
represent mild epididymitis.

Normal testicles.

## 2024-05-04 ENCOUNTER — Telehealth
